# Patient Record
Sex: Female | Born: 1945 | Race: White | Hispanic: No | Smoking: Former smoker
Health system: Southern US, Community
[De-identification: ages and names within clinical notes are randomized; demographics above are authoritative.]

## PROBLEM LIST (undated history)

## (undated) ENCOUNTER — Ambulatory Visit: Admission: EM | Payer: Medicare Other | Source: Home / Self Care

## (undated) DIAGNOSIS — Z9109 Other allergy status, other than to drugs and biological substances: Secondary | ICD-10-CM

## (undated) DIAGNOSIS — T7840XA Allergy, unspecified, initial encounter: Secondary | ICD-10-CM

## (undated) DIAGNOSIS — L409 Psoriasis, unspecified: Secondary | ICD-10-CM

## (undated) DIAGNOSIS — F32A Depression, unspecified: Secondary | ICD-10-CM

## (undated) DIAGNOSIS — F419 Anxiety disorder, unspecified: Secondary | ICD-10-CM

## (undated) DIAGNOSIS — C801 Malignant (primary) neoplasm, unspecified: Secondary | ICD-10-CM

## (undated) DIAGNOSIS — L719 Rosacea, unspecified: Secondary | ICD-10-CM

## (undated) DIAGNOSIS — Z9889 Other specified postprocedural states: Secondary | ICD-10-CM

## (undated) DIAGNOSIS — E785 Hyperlipidemia, unspecified: Secondary | ICD-10-CM

## (undated) DIAGNOSIS — M1990 Unspecified osteoarthritis, unspecified site: Secondary | ICD-10-CM

## (undated) DIAGNOSIS — E039 Hypothyroidism, unspecified: Secondary | ICD-10-CM

## (undated) DIAGNOSIS — R112 Nausea with vomiting, unspecified: Secondary | ICD-10-CM

## (undated) DIAGNOSIS — E119 Type 2 diabetes mellitus without complications: Secondary | ICD-10-CM

## (undated) DIAGNOSIS — I10 Essential (primary) hypertension: Secondary | ICD-10-CM

## (undated) HISTORY — PX: TONSILLECTOMY: SUR1361

## (undated) HISTORY — PX: BACK SURGERY: SHX140

## (undated) HISTORY — DX: Depression, unspecified: F32.A

## (undated) HISTORY — DX: Anxiety disorder, unspecified: F41.9

## (undated) HISTORY — DX: Allergy, unspecified, initial encounter: T78.40XA

## (undated) HISTORY — DX: Unspecified osteoarthritis, unspecified site: M19.90

## (undated) HISTORY — DX: Other allergy status, other than to drugs and biological substances: Z91.09

## (undated) HISTORY — DX: Hypothyroidism, unspecified: E03.9

## (undated) HISTORY — DX: Essential (primary) hypertension: I10

## (undated) HISTORY — DX: Malignant (primary) neoplasm, unspecified: C80.1

## (undated) HISTORY — DX: Hyperlipidemia, unspecified: E78.5

## (undated) HISTORY — DX: Psoriasis, unspecified: L40.9

## (undated) HISTORY — PX: APPENDECTOMY: SHX54

## (undated) HISTORY — PX: DIAGNOSTIC LAPAROSCOPY: SUR761

---

## 1972-03-12 HISTORY — PX: DILATION AND CURETTAGE OF UTERUS: SHX78

## 1972-03-12 HISTORY — PX: ECTOPIC PREGNANCY SURGERY: SHX613

## 1975-03-13 HISTORY — PX: LUMBAR DISC SURGERY: SHX700

## 1982-03-12 HISTORY — PX: ABDOMINAL HYSTERECTOMY: SHX81

## 1984-03-12 HISTORY — PX: CARPAL TUNNEL RELEASE: SHX101

## 1999-01-24 ENCOUNTER — Encounter: Admission: RE | Admit: 1999-01-24 | Discharge: 1999-01-24 | Payer: Self-pay | Admitting: Obstetrics and Gynecology

## 1999-01-24 ENCOUNTER — Encounter: Payer: Self-pay | Admitting: Obstetrics and Gynecology

## 2000-01-08 ENCOUNTER — Other Ambulatory Visit: Admission: RE | Admit: 2000-01-08 | Discharge: 2000-01-08 | Payer: Self-pay | Admitting: Obstetrics and Gynecology

## 2000-01-30 ENCOUNTER — Encounter: Admission: RE | Admit: 2000-01-30 | Discharge: 2000-01-30 | Payer: Self-pay | Admitting: Obstetrics and Gynecology

## 2000-01-30 ENCOUNTER — Encounter: Payer: Self-pay | Admitting: Obstetrics and Gynecology

## 2001-03-24 ENCOUNTER — Other Ambulatory Visit: Admission: RE | Admit: 2001-03-24 | Discharge: 2001-03-24 | Payer: Self-pay | Admitting: Obstetrics and Gynecology

## 2001-03-24 ENCOUNTER — Encounter: Payer: Self-pay | Admitting: Obstetrics and Gynecology

## 2001-03-24 ENCOUNTER — Encounter: Admission: RE | Admit: 2001-03-24 | Discharge: 2001-03-24 | Payer: Self-pay | Admitting: Obstetrics and Gynecology

## 2002-04-24 ENCOUNTER — Encounter: Admission: RE | Admit: 2002-04-24 | Discharge: 2002-04-24 | Payer: Self-pay | Admitting: Obstetrics and Gynecology

## 2002-04-24 ENCOUNTER — Encounter: Payer: Self-pay | Admitting: Obstetrics and Gynecology

## 2002-05-19 ENCOUNTER — Other Ambulatory Visit: Admission: RE | Admit: 2002-05-19 | Discharge: 2002-05-19 | Payer: Self-pay | Admitting: Obstetrics and Gynecology

## 2003-08-11 ENCOUNTER — Encounter: Admission: RE | Admit: 2003-08-11 | Discharge: 2003-08-11 | Payer: Self-pay | Admitting: Obstetrics and Gynecology

## 2004-08-25 ENCOUNTER — Encounter: Admission: RE | Admit: 2004-08-25 | Discharge: 2004-08-25 | Payer: Self-pay | Admitting: Obstetrics and Gynecology

## 2005-10-04 ENCOUNTER — Encounter: Admission: RE | Admit: 2005-10-04 | Discharge: 2005-10-04 | Payer: Self-pay | Admitting: Obstetrics and Gynecology

## 2006-11-12 ENCOUNTER — Encounter: Admission: RE | Admit: 2006-11-12 | Discharge: 2006-11-12 | Payer: Self-pay | Admitting: Obstetrics and Gynecology

## 2007-12-19 ENCOUNTER — Encounter: Admission: RE | Admit: 2007-12-19 | Discharge: 2007-12-19 | Payer: Self-pay | Admitting: Obstetrics and Gynecology

## 2009-01-10 ENCOUNTER — Encounter: Admission: RE | Admit: 2009-01-10 | Discharge: 2009-01-10 | Payer: Self-pay | Admitting: Obstetrics and Gynecology

## 2009-11-10 HISTORY — PX: MELANOMA EXCISION: SHX5266

## 2010-02-16 ENCOUNTER — Encounter
Admission: RE | Admit: 2010-02-16 | Discharge: 2010-02-16 | Payer: Self-pay | Source: Home / Self Care | Admitting: Obstetrics and Gynecology

## 2011-02-27 ENCOUNTER — Other Ambulatory Visit: Payer: Self-pay | Admitting: Obstetrics and Gynecology

## 2011-02-27 DIAGNOSIS — Z1231 Encounter for screening mammogram for malignant neoplasm of breast: Secondary | ICD-10-CM

## 2011-03-28 ENCOUNTER — Ambulatory Visit
Admission: RE | Admit: 2011-03-28 | Discharge: 2011-03-28 | Disposition: A | Discharge status: Home / Self Care | Payer: Self-pay | Source: Ambulatory Visit | Attending: Obstetrics and Gynecology | Admitting: Obstetrics and Gynecology

## 2011-03-28 DIAGNOSIS — Z1231 Encounter for screening mammogram for malignant neoplasm of breast: Secondary | ICD-10-CM

## 2011-10-15 ENCOUNTER — Emergency Department (HOSPITAL_COMMUNITY)
Admission: EM | Admit: 2011-10-15 | Discharge: 2011-10-15 | Discharge status: Absent without leave | Payer: BC Managed Care – PPO | Source: Home / Self Care | Attending: Emergency Medicine | Admitting: Emergency Medicine

## 2012-02-12 ENCOUNTER — Encounter: Payer: Self-pay | Admitting: Gastroenterology

## 2012-04-30 ENCOUNTER — Other Ambulatory Visit: Payer: Self-pay | Admitting: Obstetrics and Gynecology

## 2012-04-30 DIAGNOSIS — Z1231 Encounter for screening mammogram for malignant neoplasm of breast: Secondary | ICD-10-CM

## 2012-05-26 ENCOUNTER — Ambulatory Visit
Admission: RE | Admit: 2012-05-26 | Discharge: 2012-05-26 | Disposition: A | Discharge status: Home / Self Care | Payer: 59 | Source: Ambulatory Visit | Attending: Obstetrics and Gynecology | Admitting: Obstetrics and Gynecology

## 2012-09-19 ENCOUNTER — Encounter: Payer: Self-pay | Admitting: Gastroenterology

## 2012-12-02 ENCOUNTER — Encounter: Payer: Self-pay | Admitting: Gastroenterology

## 2013-01-12 ENCOUNTER — Ambulatory Visit (AMBULATORY_SURGERY_CENTER): Payer: Medicare Other

## 2013-01-12 VITALS — Ht 60.0 in | Wt 199.0 lb

## 2013-01-12 DIAGNOSIS — Z800 Family history of malignant neoplasm of digestive organs: Secondary | ICD-10-CM

## 2013-01-12 MED ORDER — MOVIPREP 100 G PO SOLR: 1.0000 | Freq: Once | ORAL | Status: DC

## 2013-01-15 ENCOUNTER — Encounter: Payer: Self-pay | Admitting: Gastroenterology

## 2013-01-16 ENCOUNTER — Telehealth: Payer: Self-pay | Admitting: Gastroenterology

## 2013-01-23 ENCOUNTER — Encounter: Payer: 59 | Admitting: Gastroenterology

## 2013-01-30 NOTE — Telephone Encounter (Signed)
Done by nurse 

## 2013-03-05 ENCOUNTER — Emergency Department: Payer: Self-pay | Admitting: Emergency Medicine

## 2013-03-13 ENCOUNTER — Telehealth: Payer: Self-pay | Admitting: Gastroenterology

## 2013-03-19 ENCOUNTER — Encounter: Payer: Medicare Other | Admitting: Gastroenterology

## 2013-03-19 DIAGNOSIS — I10 Essential (primary) hypertension: Secondary | ICD-10-CM | POA: Diagnosis not present

## 2013-03-19 DIAGNOSIS — E039 Hypothyroidism, unspecified: Secondary | ICD-10-CM | POA: Diagnosis not present

## 2013-03-19 DIAGNOSIS — M899 Disorder of bone, unspecified: Secondary | ICD-10-CM | POA: Diagnosis not present

## 2013-03-19 DIAGNOSIS — E785 Hyperlipidemia, unspecified: Secondary | ICD-10-CM | POA: Diagnosis not present

## 2013-03-25 DIAGNOSIS — C439 Malignant melanoma of skin, unspecified: Secondary | ICD-10-CM | POA: Diagnosis not present

## 2013-03-25 DIAGNOSIS — E039 Hypothyroidism, unspecified: Secondary | ICD-10-CM | POA: Diagnosis not present

## 2013-03-25 DIAGNOSIS — E785 Hyperlipidemia, unspecified: Secondary | ICD-10-CM | POA: Diagnosis not present

## 2013-03-25 DIAGNOSIS — I4460 Unspecified fascicular block: Secondary | ICD-10-CM | POA: Diagnosis not present

## 2013-03-25 DIAGNOSIS — M899 Disorder of bone, unspecified: Secondary | ICD-10-CM | POA: Diagnosis not present

## 2013-03-25 DIAGNOSIS — Z0000 Encounter for general adult medical examination without abnormal findings: Secondary | ICD-10-CM | POA: Diagnosis not present

## 2013-03-25 DIAGNOSIS — Z1331 Encounter for screening for depression: Secondary | ICD-10-CM | POA: Diagnosis not present

## 2013-03-25 DIAGNOSIS — L408 Other psoriasis: Secondary | ICD-10-CM | POA: Diagnosis not present

## 2013-03-25 DIAGNOSIS — I10 Essential (primary) hypertension: Secondary | ICD-10-CM | POA: Diagnosis not present

## 2013-04-14 ENCOUNTER — Encounter: Payer: Self-pay | Admitting: Gastroenterology

## 2013-04-14 ENCOUNTER — Ambulatory Visit (AMBULATORY_SURGERY_CENTER): Payer: Medicare Other | Admitting: Gastroenterology

## 2013-04-14 VITALS — BP 112/73 | HR 68 | Temp 97.8°F | Resp 21 | Ht 60.0 in | Wt 199.0 lb

## 2013-04-14 DIAGNOSIS — Z800 Family history of malignant neoplasm of digestive organs: Secondary | ICD-10-CM

## 2013-04-14 DIAGNOSIS — Z1211 Encounter for screening for malignant neoplasm of colon: Secondary | ICD-10-CM

## 2013-04-14 DIAGNOSIS — I10 Essential (primary) hypertension: Secondary | ICD-10-CM | POA: Diagnosis not present

## 2013-04-14 MED ORDER — SODIUM CHLORIDE 0.9 % IV SOLN: 500.0000 mL | INTRAVENOUS | Status: DC

## 2013-04-14 NOTE — Patient Instructions (Signed)
Discharge instructions given with verbal understanding. Normal exam. Resume previous medications. YOU HAD AN ENDOSCOPIC PROCEDURE TODAY AT THE [facility]: Refer to the procedure report that was given to you for any specific questions about what was found during the examination.  If the procedure report does not answer your questions, please call your gastroenterologist to clarify.  If you requested that your care partner not be given the details of your procedure findings, then the procedure report has been included in a sealed envelope for you to review at your convenience later.  YOU SHOULD EXPECT: Some feelings of bloating in the abdomen. Passage of more gas than usual.  Walking can help get rid of the air that was put into your GI tract during the procedure and reduce the bloating. If you had a lower endoscopy (such as a colonoscopy or flexible sigmoidoscopy) you may notice spotting of blood in your stool or on the toilet paper. If you underwent a bowel prep for your procedure, then you may not have a normal bowel movement for a few days.  DIET: Your first meal following the procedure should be a light meal and then it is ok to progress to your normal diet.  A half-sandwich or bowl of soup is an example of a good first meal.  Heavy or fried foods are harder to digest and may make you feel nauseous or bloated.  Likewise meals heavy in dairy and vegetables can cause extra gas to form and this can also increase the bloating.  Drink plenty of fluids but you should avoid alcoholic beverages for 24 hours.  ACTIVITY: Your care partner should take you home directly after the procedure.  You should plan to take it easy, moving slowly for the rest of the day.  You can resume normal activity the day after the procedure however you should NOT DRIVE or use heavy machinery for 24 hours (because of the sedation medicines used during the test).    SYMPTOMS TO REPORT IMMEDIATELY: A gastroenterologist  can be reached at any hour.  During normal business hours, 8:30 AM to 5:00 PM Monday through Friday, call [phone].  After hours and on weekends, please call the GI answering service at [phone] who will take a message and have the physician on call contact you.   Following lower endoscopy (colonoscopy or flexible sigmoidoscopy):  Excessive amounts of blood in the stool  Significant tenderness or worsening of abdominal pains  Swelling of the abdomen that is new, acute  Fever of 100F or higher  FOLLOW UP: If any biopsies were taken you will be contacted by phone or by letter within the next 1-3 weeks.  Call your gastroenterologist if you have not heard about the biopsies in 3 weeks.  Our staff will call the home number listed on your records the next business day following your procedure to check on you and address any questions or concerns that you may have at that time regarding the information given to you following your procedure. This is a courtesy call and so if there is no answer at the home number and we have not heard from you through the emergency physician on call, we will assume that you have returned to your regular daily activities without incident.  SIGNATURES/CONFIDENTIALITY: You and/or your care partner have signed paperwork which will be entered into your electronic medical record.  These signatures attest to the fact that that the information above on your After Visit Summary has been reviewed   and is understood.  Full responsibility of the confidentiality of this discharge information lies with you and/or your care-partner. 

## 2013-04-14 NOTE — Progress Notes (Signed)
A/ox3 pleased with MAC, report to Celia RN 

## 2013-04-14 NOTE — Op Note (Signed)
[place]  [address] [place], [id number]   COLONOSCOPY PROCEDURE REPORT  PATIENT: [name], [name]  MR#: [id number] BIRTHDATE: 01/16/1946 , 67  yrs. old GENDER: Female ENDOSCOPIST: Ladene Artist, MD, [facility] REFERRED EG:BTDVVOH Tisovec, M.D. PROCEDURE DATE:  04/14/2013 PROCEDURE:   Colonoscopy, screening First Screening Colonoscopy - Avg.  risk and is 50 yrs.  old or older - No.  Prior Negative Screening - Now for repeat screening. Above average risk  History of Adenoma - Now for follow-up colonoscopy & has been > or = to 3 yrs.  N/A  Polyps Removed Today? No.  Recommend repeat exam, <10 yrs? Yes.  High risk (family or personal hx). ASA CLASS:   Class II INDICATIONS:elevated risk screening and Patient's immediate family history of colon cancer-brother. MEDICATIONS: MAC sedation, administered by CRNA and propofol (Diprivan) 200mg  IV DESCRIPTION OF PROCEDURE:   After the risks benefits and alternatives of the procedure were thoroughly explained, informed consent was obtained.  A digital rectal exam revealed no abnormalities of the rectum.   The LB [id number]  endoscope was introduced through the anus and advanced to the cecum, which was identified by both the appendix and ileocecal valve. No adverse events experienced.   The quality of the prep was excellent, using MoviPrep  The instrument was then slowly withdrawn as the colon was fully examined.  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  No bleeding was noted.   The colon was otherwise normal.  There was no diverticulosis, inflammation, polyps or cancers unless previously stated.  Retroflexed views revealed no abnormalities. The time to cecum=2 minutes 26 seconds.  Withdrawal time=8 minutes 37 seconds. The scope was withdrawn and the procedure completed. COMPLICATIONS: There were no complications.  ENDOSCOPIC IMPRESSION: 1.   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   The colon was otherwise  normal  RECOMMENDATIONS: 1.  Repeat Colonoscopy in 5 years.   eSigned:  Ladene Artist, MD, [facility] 04/14/2013 11:12 AM

## 2013-04-15 ENCOUNTER — Telehealth: Payer: Self-pay | Admitting: *Deleted

## 2013-04-15 NOTE — Telephone Encounter (Signed)
  Follow up Call-  Call back number 04/14/2013  Post procedure Call Back phone  # [phone]  Permission to leave phone message Yes     Patient questions:  Do you have a fever, pain , or abdominal swelling? no Pain Score  0 *  Have you tolerated food without any problems? yes  Have you been able to return to your normal activities? yes  Do you have any questions about your discharge instructions: Diet   no Medications  no Follow up visit  no  Do you have questions or concerns about your Care? no  Actions: * If pain score is 4 or above: No action needed, pain <4.  Stated she wanted to know what avm stand for explained to pt. And informed her there was nothing for her to worry about. Pt. Stated she was doing great.

## 2013-05-08 DIAGNOSIS — L570 Actinic keratosis: Secondary | ICD-10-CM | POA: Diagnosis not present

## 2013-05-08 DIAGNOSIS — D1801 Hemangioma of skin and subcutaneous tissue: Secondary | ICD-10-CM | POA: Diagnosis not present

## 2013-05-08 DIAGNOSIS — Z85820 Personal history of malignant melanoma of skin: Secondary | ICD-10-CM | POA: Diagnosis not present

## 2013-05-08 DIAGNOSIS — L821 Other seborrheic keratosis: Secondary | ICD-10-CM | POA: Diagnosis not present

## 2013-05-08 DIAGNOSIS — I789 Disease of capillaries, unspecified: Secondary | ICD-10-CM | POA: Diagnosis not present

## 2013-05-08 DIAGNOSIS — L408 Other psoriasis: Secondary | ICD-10-CM | POA: Diagnosis not present

## 2013-05-08 DIAGNOSIS — L909 Atrophic disorder of skin, unspecified: Secondary | ICD-10-CM | POA: Diagnosis not present

## 2013-05-08 DIAGNOSIS — L819 Disorder of pigmentation, unspecified: Secondary | ICD-10-CM | POA: Diagnosis not present

## 2013-05-08 DIAGNOSIS — L919 Hypertrophic disorder of the skin, unspecified: Secondary | ICD-10-CM | POA: Diagnosis not present

## 2013-06-08 ENCOUNTER — Emergency Department: Payer: Self-pay | Admitting: Emergency Medicine

## 2013-06-08 ENCOUNTER — Other Ambulatory Visit: Payer: Self-pay

## 2013-06-08 DIAGNOSIS — Z1231 Encounter for screening mammogram for malignant neoplasm of breast: Secondary | ICD-10-CM

## 2013-06-08 DIAGNOSIS — Z79899 Other long term (current) drug therapy: Secondary | ICD-10-CM | POA: Diagnosis not present

## 2013-06-08 DIAGNOSIS — S71009A Unspecified open wound, unspecified hip, initial encounter: Secondary | ICD-10-CM | POA: Diagnosis not present

## 2013-06-08 DIAGNOSIS — M1610 Unilateral primary osteoarthritis, unspecified hip: Secondary | ICD-10-CM | POA: Diagnosis not present

## 2013-06-08 DIAGNOSIS — IMO0002 Reserved for concepts with insufficient information to code with codable children: Secondary | ICD-10-CM | POA: Diagnosis not present

## 2013-06-08 DIAGNOSIS — R634 Abnormal weight loss: Secondary | ICD-10-CM | POA: Diagnosis not present

## 2013-06-08 DIAGNOSIS — S32009A Unspecified fracture of unspecified lumbar vertebra, initial encounter for closed fracture: Secondary | ICD-10-CM | POA: Diagnosis not present

## 2013-06-08 DIAGNOSIS — I10 Essential (primary) hypertension: Secondary | ICD-10-CM | POA: Diagnosis not present

## 2013-06-08 DIAGNOSIS — Z90710 Acquired absence of both cervix and uterus: Secondary | ICD-10-CM | POA: Diagnosis not present

## 2013-06-08 DIAGNOSIS — E039 Hypothyroidism, unspecified: Secondary | ICD-10-CM | POA: Diagnosis not present

## 2013-06-08 DIAGNOSIS — S72009A Fracture of unspecified part of neck of unspecified femur, initial encounter for closed fracture: Secondary | ICD-10-CM | POA: Diagnosis not present

## 2013-06-08 DIAGNOSIS — M169 Osteoarthritis of hip, unspecified: Secondary | ICD-10-CM | POA: Diagnosis not present

## 2013-06-08 DIAGNOSIS — M549 Dorsalgia, unspecified: Secondary | ICD-10-CM | POA: Diagnosis not present

## 2013-06-10 DIAGNOSIS — S72109A Unspecified trochanteric fracture of unspecified femur, initial encounter for closed fracture: Secondary | ICD-10-CM | POA: Diagnosis not present

## 2013-06-10 DIAGNOSIS — S32009A Unspecified fracture of unspecified lumbar vertebra, initial encounter for closed fracture: Secondary | ICD-10-CM | POA: Diagnosis not present

## 2013-06-30 ENCOUNTER — Ambulatory Visit
Admission: RE | Admit: 2013-06-30 | Discharge: 2013-06-30 | Disposition: A | Discharge status: Home / Self Care | Payer: Medicare Other | Source: Ambulatory Visit

## 2013-06-30 DIAGNOSIS — Z1231 Encounter for screening mammogram for malignant neoplasm of breast: Secondary | ICD-10-CM

## 2013-07-03 DIAGNOSIS — S72009D Fracture of unspecified part of neck of unspecified femur, subsequent encounter for closed fracture with routine healing: Secondary | ICD-10-CM | POA: Diagnosis not present

## 2013-07-03 DIAGNOSIS — IMO0002 Reserved for concepts with insufficient information to code with codable children: Secondary | ICD-10-CM | POA: Diagnosis not present

## 2013-07-08 DIAGNOSIS — L408 Other psoriasis: Secondary | ICD-10-CM | POA: Diagnosis not present

## 2013-07-08 DIAGNOSIS — Z79899 Other long term (current) drug therapy: Secondary | ICD-10-CM | POA: Diagnosis not present

## 2013-07-10 DIAGNOSIS — Z01419 Encounter for gynecological examination (general) (routine) without abnormal findings: Secondary | ICD-10-CM | POA: Diagnosis not present

## 2013-07-10 DIAGNOSIS — Z124 Encounter for screening for malignant neoplasm of cervix: Secondary | ICD-10-CM | POA: Diagnosis not present

## 2013-09-22 DIAGNOSIS — Z23 Encounter for immunization: Secondary | ICD-10-CM | POA: Diagnosis not present

## 2013-09-22 DIAGNOSIS — E785 Hyperlipidemia, unspecified: Secondary | ICD-10-CM | POA: Diagnosis not present

## 2013-09-22 DIAGNOSIS — E039 Hypothyroidism, unspecified: Secondary | ICD-10-CM | POA: Diagnosis not present

## 2013-09-22 DIAGNOSIS — I10 Essential (primary) hypertension: Secondary | ICD-10-CM | POA: Diagnosis not present

## 2013-09-22 DIAGNOSIS — F321 Major depressive disorder, single episode, moderate: Secondary | ICD-10-CM | POA: Diagnosis not present

## 2013-09-22 DIAGNOSIS — M899 Disorder of bone, unspecified: Secondary | ICD-10-CM | POA: Diagnosis not present

## 2013-09-22 DIAGNOSIS — J301 Allergic rhinitis due to pollen: Secondary | ICD-10-CM | POA: Diagnosis not present

## 2013-09-22 DIAGNOSIS — L719 Rosacea, unspecified: Secondary | ICD-10-CM | POA: Diagnosis not present

## 2013-09-22 DIAGNOSIS — M949 Disorder of cartilage, unspecified: Secondary | ICD-10-CM | POA: Diagnosis not present

## 2013-11-06 DIAGNOSIS — L408 Other psoriasis: Secondary | ICD-10-CM | POA: Diagnosis not present

## 2013-11-06 DIAGNOSIS — D1801 Hemangioma of skin and subcutaneous tissue: Secondary | ICD-10-CM | POA: Diagnosis not present

## 2013-11-06 DIAGNOSIS — L719 Rosacea, unspecified: Secondary | ICD-10-CM | POA: Diagnosis not present

## 2013-11-06 DIAGNOSIS — Z79899 Other long term (current) drug therapy: Secondary | ICD-10-CM | POA: Diagnosis not present

## 2013-12-10 DIAGNOSIS — Z23 Encounter for immunization: Secondary | ICD-10-CM | POA: Diagnosis not present

## 2014-02-11 DIAGNOSIS — L718 Other rosacea: Secondary | ICD-10-CM | POA: Diagnosis not present

## 2014-02-11 DIAGNOSIS — Z79899 Other long term (current) drug therapy: Secondary | ICD-10-CM | POA: Diagnosis not present

## 2014-02-11 DIAGNOSIS — D1801 Hemangioma of skin and subcutaneous tissue: Secondary | ICD-10-CM | POA: Diagnosis not present

## 2014-02-11 DIAGNOSIS — L400 Psoriasis vulgaris: Secondary | ICD-10-CM | POA: Diagnosis not present

## 2014-02-26 DIAGNOSIS — Z79899 Other long term (current) drug therapy: Secondary | ICD-10-CM | POA: Diagnosis not present

## 2014-02-26 DIAGNOSIS — L400 Psoriasis vulgaris: Secondary | ICD-10-CM | POA: Diagnosis not present

## 2014-03-31 DIAGNOSIS — H0016 Chalazion left eye, unspecified eyelid: Secondary | ICD-10-CM | POA: Diagnosis not present

## 2014-05-10 DIAGNOSIS — M859 Disorder of bone density and structure, unspecified: Secondary | ICD-10-CM | POA: Diagnosis not present

## 2014-05-10 DIAGNOSIS — E785 Hyperlipidemia, unspecified: Secondary | ICD-10-CM | POA: Diagnosis not present

## 2014-05-10 DIAGNOSIS — E039 Hypothyroidism, unspecified: Secondary | ICD-10-CM | POA: Diagnosis not present

## 2014-05-10 DIAGNOSIS — I10 Essential (primary) hypertension: Secondary | ICD-10-CM | POA: Diagnosis not present

## 2014-05-11 DIAGNOSIS — Z1389 Encounter for screening for other disorder: Secondary | ICD-10-CM | POA: Diagnosis not present

## 2014-05-11 DIAGNOSIS — L409 Psoriasis, unspecified: Secondary | ICD-10-CM | POA: Diagnosis not present

## 2014-05-11 DIAGNOSIS — M859 Disorder of bone density and structure, unspecified: Secondary | ICD-10-CM | POA: Diagnosis not present

## 2014-05-11 DIAGNOSIS — Z79899 Other long term (current) drug therapy: Secondary | ICD-10-CM | POA: Diagnosis not present

## 2014-05-11 DIAGNOSIS — E119 Type 2 diabetes mellitus without complications: Secondary | ICD-10-CM | POA: Diagnosis not present

## 2014-05-11 DIAGNOSIS — E785 Hyperlipidemia, unspecified: Secondary | ICD-10-CM | POA: Diagnosis not present

## 2014-05-11 DIAGNOSIS — Z6841 Body Mass Index (BMI) 40.0 and over, adult: Secondary | ICD-10-CM | POA: Diagnosis not present

## 2014-05-11 DIAGNOSIS — I4460 Unspecified fascicular block: Secondary | ICD-10-CM | POA: Diagnosis not present

## 2014-05-11 DIAGNOSIS — Z0000 Encounter for general adult medical examination without abnormal findings: Secondary | ICD-10-CM | POA: Diagnosis not present

## 2014-05-11 DIAGNOSIS — F321 Major depressive disorder, single episode, moderate: Secondary | ICD-10-CM | POA: Diagnosis not present

## 2014-05-13 DIAGNOSIS — Z1212 Encounter for screening for malignant neoplasm of rectum: Secondary | ICD-10-CM | POA: Diagnosis not present

## 2014-06-22 DIAGNOSIS — E119 Type 2 diabetes mellitus without complications: Secondary | ICD-10-CM | POA: Diagnosis not present

## 2014-06-22 DIAGNOSIS — Z6838 Body mass index (BMI) 38.0-38.9, adult: Secondary | ICD-10-CM | POA: Diagnosis not present

## 2014-06-22 DIAGNOSIS — I10 Essential (primary) hypertension: Secondary | ICD-10-CM | POA: Diagnosis not present

## 2014-06-30 DIAGNOSIS — L719 Rosacea, unspecified: Secondary | ICD-10-CM | POA: Diagnosis not present

## 2014-07-02 DIAGNOSIS — L718 Other rosacea: Secondary | ICD-10-CM | POA: Diagnosis not present

## 2014-07-02 DIAGNOSIS — D2261 Melanocytic nevi of right upper limb, including shoulder: Secondary | ICD-10-CM | POA: Diagnosis not present

## 2014-07-02 DIAGNOSIS — L821 Other seborrheic keratosis: Secondary | ICD-10-CM | POA: Diagnosis not present

## 2014-07-02 DIAGNOSIS — D485 Neoplasm of uncertain behavior of skin: Secondary | ICD-10-CM | POA: Diagnosis not present

## 2014-07-02 DIAGNOSIS — L400 Psoriasis vulgaris: Secondary | ICD-10-CM | POA: Diagnosis not present

## 2014-07-02 DIAGNOSIS — Z85820 Personal history of malignant melanoma of skin: Secondary | ICD-10-CM | POA: Diagnosis not present

## 2014-07-02 DIAGNOSIS — L814 Other melanin hyperpigmentation: Secondary | ICD-10-CM | POA: Diagnosis not present

## 2014-07-02 DIAGNOSIS — L918 Other hypertrophic disorders of the skin: Secondary | ICD-10-CM | POA: Diagnosis not present

## 2014-07-06 ENCOUNTER — Other Ambulatory Visit: Payer: Self-pay

## 2014-07-06 DIAGNOSIS — Z1231 Encounter for screening mammogram for malignant neoplasm of breast: Secondary | ICD-10-CM

## 2014-07-08 DIAGNOSIS — H01009 Unspecified blepharitis unspecified eye, unspecified eyelid: Secondary | ICD-10-CM | POA: Diagnosis not present

## 2014-08-02 DIAGNOSIS — L718 Other rosacea: Secondary | ICD-10-CM | POA: Diagnosis not present

## 2014-08-10 ENCOUNTER — Ambulatory Visit
Admission: RE | Admit: 2014-08-10 | Discharge: 2014-08-10 | Disposition: A | Discharge status: Home / Self Care | Payer: Medicare Other | Source: Ambulatory Visit

## 2014-08-10 DIAGNOSIS — F321 Major depressive disorder, single episode, moderate: Secondary | ICD-10-CM | POA: Diagnosis not present

## 2014-08-10 DIAGNOSIS — E119 Type 2 diabetes mellitus without complications: Secondary | ICD-10-CM | POA: Diagnosis not present

## 2014-08-10 DIAGNOSIS — Z1231 Encounter for screening mammogram for malignant neoplasm of breast: Secondary | ICD-10-CM

## 2014-08-10 DIAGNOSIS — L409 Psoriasis, unspecified: Secondary | ICD-10-CM | POA: Diagnosis not present

## 2014-08-10 DIAGNOSIS — M859 Disorder of bone density and structure, unspecified: Secondary | ICD-10-CM | POA: Diagnosis not present

## 2014-08-10 DIAGNOSIS — E039 Hypothyroidism, unspecified: Secondary | ICD-10-CM | POA: Diagnosis not present

## 2014-08-10 DIAGNOSIS — Z6836 Body mass index (BMI) 36.0-36.9, adult: Secondary | ICD-10-CM | POA: Diagnosis not present

## 2014-08-11 ENCOUNTER — Other Ambulatory Visit: Payer: Self-pay | Admitting: Obstetrics and Gynecology

## 2014-08-11 DIAGNOSIS — R928 Other abnormal and inconclusive findings on diagnostic imaging of breast: Secondary | ICD-10-CM

## 2014-08-12 ENCOUNTER — Ambulatory Visit
Admission: RE | Admit: 2014-08-12 | Discharge: 2014-08-12 | Disposition: A | Discharge status: Home / Self Care | Payer: Medicare Other | Source: Ambulatory Visit | Attending: Obstetrics and Gynecology | Admitting: Obstetrics and Gynecology

## 2014-08-12 DIAGNOSIS — R928 Other abnormal and inconclusive findings on diagnostic imaging of breast: Secondary | ICD-10-CM

## 2014-10-01 DIAGNOSIS — Z79899 Other long term (current) drug therapy: Secondary | ICD-10-CM | POA: Diagnosis not present

## 2014-10-01 DIAGNOSIS — L400 Psoriasis vulgaris: Secondary | ICD-10-CM | POA: Diagnosis not present

## 2014-10-25 DIAGNOSIS — M546 Pain in thoracic spine: Secondary | ICD-10-CM | POA: Diagnosis not present

## 2014-10-25 DIAGNOSIS — G589 Mononeuropathy, unspecified: Secondary | ICD-10-CM | POA: Diagnosis not present

## 2014-10-25 DIAGNOSIS — Z6834 Body mass index (BMI) 34.0-34.9, adult: Secondary | ICD-10-CM | POA: Diagnosis not present

## 2014-12-10 DIAGNOSIS — F321 Major depressive disorder, single episode, moderate: Secondary | ICD-10-CM | POA: Diagnosis not present

## 2014-12-10 DIAGNOSIS — E039 Hypothyroidism, unspecified: Secondary | ICD-10-CM | POA: Diagnosis not present

## 2014-12-10 DIAGNOSIS — M859 Disorder of bone density and structure, unspecified: Secondary | ICD-10-CM | POA: Diagnosis not present

## 2014-12-10 DIAGNOSIS — Z23 Encounter for immunization: Secondary | ICD-10-CM | POA: Diagnosis not present

## 2014-12-10 DIAGNOSIS — E785 Hyperlipidemia, unspecified: Secondary | ICD-10-CM | POA: Diagnosis not present

## 2014-12-10 DIAGNOSIS — I10 Essential (primary) hypertension: Secondary | ICD-10-CM | POA: Diagnosis not present

## 2014-12-10 DIAGNOSIS — L409 Psoriasis, unspecified: Secondary | ICD-10-CM | POA: Diagnosis not present

## 2014-12-10 DIAGNOSIS — E119 Type 2 diabetes mellitus without complications: Secondary | ICD-10-CM | POA: Diagnosis not present

## 2014-12-10 DIAGNOSIS — J302 Other seasonal allergic rhinitis: Secondary | ICD-10-CM | POA: Diagnosis not present

## 2014-12-10 DIAGNOSIS — Z6832 Body mass index (BMI) 32.0-32.9, adult: Secondary | ICD-10-CM | POA: Diagnosis not present

## 2015-01-05 DIAGNOSIS — L400 Psoriasis vulgaris: Secondary | ICD-10-CM | POA: Diagnosis not present

## 2015-03-17 DIAGNOSIS — D485 Neoplasm of uncertain behavior of skin: Secondary | ICD-10-CM | POA: Diagnosis not present

## 2015-03-17 DIAGNOSIS — Z79899 Other long term (current) drug therapy: Secondary | ICD-10-CM | POA: Diagnosis not present

## 2015-03-17 DIAGNOSIS — L400 Psoriasis vulgaris: Secondary | ICD-10-CM | POA: Diagnosis not present

## 2015-03-17 DIAGNOSIS — C4361 Malignant melanoma of right upper limb, including shoulder: Secondary | ICD-10-CM | POA: Diagnosis not present

## 2015-03-17 DIAGNOSIS — D1801 Hemangioma of skin and subcutaneous tissue: Secondary | ICD-10-CM | POA: Diagnosis not present

## 2015-03-17 DIAGNOSIS — L718 Other rosacea: Secondary | ICD-10-CM | POA: Diagnosis not present

## 2015-03-24 ENCOUNTER — Ambulatory Visit: Payer: Self-pay | Admitting: Surgery

## 2015-03-24 DIAGNOSIS — C4360 Malignant melanoma of unspecified upper limb, including shoulder: Secondary | ICD-10-CM | POA: Diagnosis not present

## 2015-03-24 DIAGNOSIS — C439 Malignant melanoma of skin, unspecified: Secondary | ICD-10-CM

## 2015-03-24 NOTE — H&P (Signed)
Armetta C. Cassaday 03/24/2015 2:41 PM Location: [facility] Patient #: [id number] DOB: 03-Apr-1945 Undefined / Language: [name] / Race: White Female  History of Present Illness Marcello Moores A. Catrell Morrone MD; 03/24/2015 3:36 PM) Patient words: right shoulder melanoma/ needs SLN biopsy   Pt sent at the request of Dr Ubaldo Glassing for right shoulder melanoma. She has a personal hx of melanoma in the past.   path shows superficial spreading melanoma  Clark level 4 1.1 cm thickness   Denies complaints.  The patient is a 70 year old female.   Other Problems [name], CMA; 03/24/2015 2:42 PM) Back Pain Diabetes Mellitus Gastroesophageal Reflux Disease High blood pressure Melanoma Thyroid Disease  Past Surgical History [name], [place]; 03/24/2015 2:42 PM) Appendectomy Cesarean Section - Multiple Hysterectomy (not due to cancer) - Complete  Diagnostic Studies History [name], [place]; 03/24/2015 2:42 PM) Colonoscopy 1-5 years ago Mammogram within last year  Allergies [name], CMA; 03/24/2015 2:43 PM) Penicillin G Pot in Dextrose *PENICILLINS*  Medication History [name], CMA; 03/24/2015 2:44 PM) Calcipotriene-Betameth Diprop (0.005-0.064% Ointment, External) Active. Synthroid (100MCG Tablet, Oral) Active. Lisinopril-Hydrochlorothiazide (10-12.5MG  Tablet, Oral) Active. Montelukast Sodium (10MG  Tablet, Oral) Active. Finacea (15% Gel, External) Active. Aspirin (81MG  Tablet DR, Oral) Active. Calcium Carbonate-Vit D-Min (600-200MG -UNIT Tablet, Oral) Active. Medications Reconciled  Pregnancy / Birth History [name], [place]; 03/24/2015 2:42 PM) Age at menarche 71 years. Age of menopause <47 Gravida 4 Maternal age 56-30 Para 2     Review of Systems [name]; 03/24/2015 2:42 PM) General Not Present- Appetite Loss, Chills, Fatigue, Fever, Night Sweats, Weight Gain and Weight Loss. Skin Not Present-  Change in Wart/Mole, Dryness, Hives, Jaundice, New Lesions, Non-Healing Wounds, Rash and Ulcer. HEENT Present- Seasonal Allergies and Wears glasses/contact lenses. Not Present- Earache, Hearing Loss, Hoarseness, Nose Bleed, Oral Ulcers, Ringing in the Ears, Sinus Pain, Sore Throat, Visual Disturbances and Yellow Eyes. Respiratory Not Present- Bloody sputum, Chronic Cough, Difficulty Breathing, Snoring and Wheezing. Breast Not Present- Breast Mass, Breast Pain, Nipple Discharge and Skin Changes. Cardiovascular Not Present- Chest Pain, Difficulty Breathing Lying Down, Leg Cramps, Palpitations, Rapid Heart Rate, Shortness of Breath and Swelling of Extremities. Gastrointestinal Present- Hemorrhoids. Not Present- Abdominal Pain, Bloating, Bloody Stool, Change in Bowel Habits, Chronic diarrhea, Constipation, Difficulty Swallowing, Excessive gas, Gets full quickly at meals, Indigestion, Nausea, Rectal Pain and Vomiting. Female Genitourinary Not Present- Frequency, Nocturia, Painful Urination, Pelvic Pain and Urgency. Musculoskeletal Not Present- Back Pain, Joint Pain, Joint Stiffness, Muscle Pain, Muscle Weakness and Swelling of Extremities. Neurological Not Present- Decreased Memory, Fainting, Headaches, Numbness, Seizures, Tingling, Tremor, Trouble walking and Weakness. Psychiatric Not Present- Anxiety, Bipolar, Change in Sleep Pattern, Depression, Fearful and Frequent crying. Endocrine Not Present- Cold Intolerance, Excessive Hunger, Hair Changes, Heat Intolerance, Hot flashes and New Diabetes. Hematology Present- Easy Bruising. Not Present- Excessive bleeding, Gland problems, HIV and Persistent Infections.  [name]; 03/24/2015 2:41 PM) 03/24/2015 2:41 PM Weight: 154 lb Height: 59in Body Surface Area: 1.65 m Body Mass Index: 31.1 kg/m  Temp.: 98.65F(Oral)  BP: 132/78 (Sitting, Left Arm, Standard)      Physical Exam (Muaaz Brau A. Michio Thier MD; 03/24/2015 3:37  PM)  Integumentary Note: 1 cm incision right shoulder clean sutures intact  Chest and Lung Exam Chest and lung exam reveals -quiet, even and easy respiratory effort with no use of accessory muscles and on auscultation, normal breath sounds, no adventitious sounds and normal vocal resonance. Inspection Chest Wall - Normal.  Back - normal.  Cardiovascular Cardiovascular examination reveals -on palpation PMI is normal in location and amplitude, no palpable S3 or S4. Normal cardiac borders., normal heart sounds, regular rate and rhythm with no murmurs, carotid auscultation reveals no bruits and normal pedal pulses bilaterally.  Lymphatic Head & Neck  General Head & Neck Lymphatics: Bilateral - Description - Normal. Axillary  General Axillary Region: Bilateral - Description - Normal. Tenderness - Non Tender.    Assessment & Plan (Brae Gartman A. 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 stiffness, dye allergy. cosmetic deformity , blood clots, death, need for more surgery. Pt agres to proceed.  Discussed risks of infection cosmetic deformity shoulder stiffness and adjacent organ injury she will need preop lymphscintigraphy she agrees to proceed  Current Plans The anatomy and the physiology was discussed. The pathophysiology and natural history of the disease was discussed. Options were discussed and recommendations were made. Technique, risks, benefits, & alternatives were discussed. Risks such as stroke, heart attack, bleeding, indection, death, and other risks discussed. Questions answered. The patient agrees to proceed. The pathophysiology of skin & subcutaneous masses was discussed. Natural history risks without surgery were  discussed. I recommended surgery to remove the mass. I explained the technique of removal with use of local anesthesia & possible need for more aggressive sedation/anesthesia for patient comfort.  Risks such as bleeding, infection, wound breakdown, heart attack, death, and other risks were discussed. I noted a good likelihood this will help address the problem. Possibility that this will not correct all symptoms was explained. Possibility of regrowth/recurrence of the mass was discussed. We will work to minimize complications. Questions were answered. The patient expresses understanding & wishes to proceed with surgery.  Pt Education - CCS Free Text Education/Instructions: discussed with patient and provided information.

## 2015-03-31 DIAGNOSIS — L918 Other hypertrophic disorders of the skin: Secondary | ICD-10-CM | POA: Diagnosis not present

## 2015-03-31 DIAGNOSIS — Z85820 Personal history of malignant melanoma of skin: Secondary | ICD-10-CM | POA: Diagnosis not present

## 2015-03-31 DIAGNOSIS — L400 Psoriasis vulgaris: Secondary | ICD-10-CM | POA: Diagnosis not present

## 2015-03-31 DIAGNOSIS — D1801 Hemangioma of skin and subcutaneous tissue: Secondary | ICD-10-CM | POA: Diagnosis not present

## 2015-03-31 DIAGNOSIS — L821 Other seborrheic keratosis: Secondary | ICD-10-CM | POA: Diagnosis not present

## 2015-04-06 ENCOUNTER — Other Ambulatory Visit (HOSPITAL_COMMUNITY): Payer: Self-pay | Admitting: Surgery

## 2015-04-06 DIAGNOSIS — C4361 Malignant melanoma of right upper limb, including shoulder: Secondary | ICD-10-CM

## 2015-04-13 ENCOUNTER — Ambulatory Visit (HOSPITAL_COMMUNITY)
Admission: RE | Admit: 2015-04-13 | Discharge: 2015-04-13 | Disposition: A | Discharge status: Home / Self Care | Payer: Medicare Other | Source: Ambulatory Visit | Attending: Surgery | Admitting: Surgery

## 2015-04-13 DIAGNOSIS — C4361 Malignant melanoma of right upper limb, including shoulder: Secondary | ICD-10-CM | POA: Diagnosis not present

## 2015-04-13 MED ORDER — TECHNETIUM TC 99M SULFUR COLLOID FILTERED
0.5000 | Freq: Once | INTRAVENOUS | Status: AC | PRN
  Administered 2015-04-13: 0.5 via INTRADERMAL

## 2015-04-15 ENCOUNTER — Encounter (HOSPITAL_BASED_OUTPATIENT_CLINIC_OR_DEPARTMENT_OTHER): Payer: Self-pay | Admitting: *Deleted

## 2015-04-18 ENCOUNTER — Other Ambulatory Visit: Payer: Self-pay

## 2015-04-18 ENCOUNTER — Encounter (HOSPITAL_BASED_OUTPATIENT_CLINIC_OR_DEPARTMENT_OTHER)
Admission: RE | Admit: 2015-04-18 | Discharge: 2015-04-18 | Disposition: A | Discharge status: Home / Self Care | Payer: Medicare Other | Source: Ambulatory Visit | Attending: Surgery | Admitting: Surgery

## 2015-04-18 DIAGNOSIS — E119 Type 2 diabetes mellitus without complications: Secondary | ICD-10-CM | POA: Diagnosis not present

## 2015-04-18 DIAGNOSIS — Z87891 Personal history of nicotine dependence: Secondary | ICD-10-CM | POA: Diagnosis not present

## 2015-04-18 DIAGNOSIS — C4361 Malignant melanoma of right upper limb, including shoulder: Secondary | ICD-10-CM | POA: Diagnosis not present

## 2015-04-18 DIAGNOSIS — E039 Hypothyroidism, unspecified: Secondary | ICD-10-CM | POA: Diagnosis not present

## 2015-04-18 DIAGNOSIS — I10 Essential (primary) hypertension: Secondary | ICD-10-CM | POA: Diagnosis not present

## 2015-04-18 LAB — CBC WITH DIFFERENTIAL/PLATELET
BASOS ABS: 0 10*3/uL (ref 0.0–0.1)
BASOS PCT: 0 %
EOS ABS: 0.2 10*3/uL (ref 0.0–0.7)
Eosinophils Relative: 2 %
HCT: 41.4 % (ref 36.0–46.0)
Hemoglobin: 13.6 g/dL (ref 12.0–15.0)
LYMPHS ABS: 4.5 10*3/uL — AB (ref 0.7–4.0)
Lymphocytes Relative: 43 %
MCH: 30.2 pg (ref 26.0–34.0)
MCHC: 32.9 g/dL (ref 30.0–36.0)
MCV: 91.8 fL (ref 78.0–100.0)
Monocytes Absolute: 0.7 10*3/uL (ref 0.1–1.0)
Monocytes Relative: 7 %
NEUTROS ABS: 5.1 10*3/uL (ref 1.7–7.7)
NEUTROS PCT: 48 %
PLATELETS: 211 10*3/uL (ref 150–400)
RBC: 4.51 MIL/uL (ref 3.87–5.11)
RDW: 13.4 % (ref 11.5–15.5)
WBC: 10.5 10*3/uL (ref 4.0–10.5)

## 2015-04-18 LAB — COMPREHENSIVE METABOLIC PANEL
ALBUMIN: 3.7 g/dL (ref 3.5–5.0)
ALT: 28 U/L (ref 14–54)
AST: 31 U/L (ref 15–41)
Alkaline Phosphatase: 64 U/L (ref 38–126)
Anion gap: 11 (ref 5–15)
BUN: 17 mg/dL (ref 6–20)
CO2: 29 mmol/L (ref 22–32)
Calcium: 9.4 mg/dL (ref 8.9–10.3)
Chloride: 92 mmol/L — ABNORMAL LOW (ref 101–111)
Creatinine, Ser: 0.85 mg/dL (ref 0.44–1.00)
GFR calc Af Amer: >60 mL/min (ref >60)
GFR calc non Af Amer: >60 mL/min (ref >60)
GLUCOSE: 98 mg/dL (ref 65–99)
POTASSIUM: 3.9 mmol/L (ref 3.5–5.1)
Sodium: 132 mmol/L — ABNORMAL LOW (ref 135–145)
Total Bilirubin: 0.9 mg/dL (ref 0.3–1.2)
Total Protein: 6.7 g/dL (ref 6.5–8.1)

## 2015-04-21 ENCOUNTER — Ambulatory Visit (HOSPITAL_BASED_OUTPATIENT_CLINIC_OR_DEPARTMENT_OTHER)
Admission: RE | Admit: 2015-04-21 | Discharge: 2015-04-21 | Disposition: A | Discharge status: Home / Self Care | Payer: Medicare Other | Source: Ambulatory Visit | Attending: Surgery | Admitting: Surgery

## 2015-04-21 ENCOUNTER — Encounter (HOSPITAL_BASED_OUTPATIENT_CLINIC_OR_DEPARTMENT_OTHER): Payer: Self-pay | Admitting: *Deleted

## 2015-04-21 ENCOUNTER — Ambulatory Visit (HOSPITAL_COMMUNITY)
Admission: RE | Admit: 2015-04-21 | Discharge: 2015-04-21 | Disposition: A | Discharge status: Home / Self Care | Payer: Medicare Other | Source: Ambulatory Visit | Attending: Surgery | Admitting: Surgery

## 2015-04-21 ENCOUNTER — Ambulatory Visit (HOSPITAL_BASED_OUTPATIENT_CLINIC_OR_DEPARTMENT_OTHER): Payer: Medicare Other | Admitting: Anesthesiology

## 2015-04-21 ENCOUNTER — Encounter (HOSPITAL_BASED_OUTPATIENT_CLINIC_OR_DEPARTMENT_OTHER)
Admission: RE | Disposition: A | Discharge status: Home / Self Care | Payer: Self-pay | Source: Ambulatory Visit | Attending: Surgery

## 2015-04-21 DIAGNOSIS — C439 Malignant melanoma of skin, unspecified: Secondary | ICD-10-CM

## 2015-04-21 DIAGNOSIS — E119 Type 2 diabetes mellitus without complications: Secondary | ICD-10-CM | POA: Insufficient documentation

## 2015-04-21 DIAGNOSIS — M25511 Pain in right shoulder: Secondary | ICD-10-CM | POA: Diagnosis not present

## 2015-04-21 DIAGNOSIS — C4361 Malignant melanoma of right upper limb, including shoulder: Secondary | ICD-10-CM | POA: Diagnosis not present

## 2015-04-21 DIAGNOSIS — I10 Essential (primary) hypertension: Secondary | ICD-10-CM | POA: Insufficient documentation

## 2015-04-21 DIAGNOSIS — Z87891 Personal history of nicotine dependence: Secondary | ICD-10-CM | POA: Insufficient documentation

## 2015-04-21 DIAGNOSIS — E039 Hypothyroidism, unspecified: Secondary | ICD-10-CM | POA: Diagnosis not present

## 2015-04-21 DIAGNOSIS — G8918 Other acute postprocedural pain: Secondary | ICD-10-CM | POA: Diagnosis not present

## 2015-04-21 DIAGNOSIS — L859 Epidermal thickening, unspecified: Secondary | ICD-10-CM | POA: Diagnosis not present

## 2015-04-21 HISTORY — DX: Rosacea, unspecified: L71.9

## 2015-04-21 HISTORY — DX: Other specified postprocedural states: Z98.890

## 2015-04-21 HISTORY — PX: MELANOMA EXCISION WITH SENTINEL LYMPH NODE BIOPSY: SHX5267

## 2015-04-21 HISTORY — DX: Nausea with vomiting, unspecified: R11.2

## 2015-04-21 SURGERY — MELANOMA EXCISION WITH SENTINEL LYMPH NODE BIOPSY
Anesthesia: General | Site: Shoulder | Laterality: Right

## 2015-04-21 MED ORDER — TECHNETIUM TC 99M SULFUR COLLOID FILTERED: 0.5000 | Freq: Once | INTRAVENOUS | Status: DC | PRN

## 2015-04-21 MED ORDER — FENTANYL CITRATE (PF) 100 MCG/2ML IJ SOLN
INTRAMUSCULAR | Status: AC
  Filled 2015-04-21: qty 2

## 2015-04-21 MED ORDER — ONDANSETRON HCL 4 MG/2ML IJ SOLN
INTRAMUSCULAR | Status: DC | PRN
  Administered 2015-04-21: 4 mg via INTRAVENOUS

## 2015-04-21 MED ORDER — LACTATED RINGERS IV SOLN
INTRAVENOUS | Status: DC
  Administered 2015-04-21 (×3): via INTRAVENOUS

## 2015-04-21 MED ORDER — DEXAMETHASONE SODIUM PHOSPHATE 10 MG/ML IJ SOLN
INTRAMUSCULAR | Status: AC
  Filled 2015-04-21: qty 1

## 2015-04-21 MED ORDER — HYDROMORPHONE HCL 1 MG/ML IJ SOLN
0.2500 mg | INTRAMUSCULAR | Status: DC | PRN
  Administered 2015-04-21 (×2): 0.5 mg via INTRAVENOUS

## 2015-04-21 MED ORDER — CLINDAMYCIN PHOSPHATE 300 MG/50ML IV SOLN
300.0000 mg | Freq: Once | INTRAVENOUS | Status: AC
  Administered 2015-04-21: 300 mg via INTRAVENOUS

## 2015-04-21 MED ORDER — HYDROMORPHONE HCL 1 MG/ML IJ SOLN
INTRAMUSCULAR | Status: AC
  Filled 2015-04-21: qty 1

## 2015-04-21 MED ORDER — CLINDAMYCIN PHOSPHATE 300 MG/50ML IV SOLN
INTRAVENOUS | Status: AC
  Filled 2015-04-21: qty 50

## 2015-04-21 MED ORDER — LIDOCAINE HCL (CARDIAC) 20 MG/ML IV SOLN
INTRAVENOUS | Status: DC | PRN
  Administered 2015-04-21: 60 mg via INTRAVENOUS

## 2015-04-21 MED ORDER — SODIUM CHLORIDE 0.9 % IJ SOLN
INTRAVENOUS | Status: DC | PRN
  Administered 2015-04-21: 5 mL

## 2015-04-21 MED ORDER — MIDAZOLAM HCL 2 MG/2ML IJ SOLN
1.0000 mg | INTRAMUSCULAR | Status: DC | PRN
  Administered 2015-04-21: 1 mg via INTRAVENOUS

## 2015-04-21 MED ORDER — GLYCOPYRROLATE 0.2 MG/ML IJ SOLN: 0.2000 mg | Freq: Once | INTRAMUSCULAR | Status: DC | PRN

## 2015-04-21 MED ORDER — SCOPOLAMINE 1 MG/3DAYS TD PT72: 1.0000 | MEDICATED_PATCH | Freq: Once | TRANSDERMAL | Status: DC | PRN

## 2015-04-21 MED ORDER — KETOROLAC TROMETHAMINE 30 MG/ML IJ SOLN: 30.0000 mg | Freq: Once | INTRAMUSCULAR | Status: DC | PRN

## 2015-04-21 MED ORDER — DEXAMETHASONE SODIUM PHOSPHATE 4 MG/ML IJ SOLN
INTRAMUSCULAR | Status: DC | PRN
  Administered 2015-04-21: 10 mg via INTRAVENOUS

## 2015-04-21 MED ORDER — BUPIVACAINE HCL (PF) 0.5 % IJ SOLN
INTRAMUSCULAR | Status: DC | PRN
  Administered 2015-04-21: 30 mL via PERINEURAL

## 2015-04-21 MED ORDER — PROPOFOL 10 MG/ML IV BOLUS
INTRAVENOUS | Status: DC | PRN
  Administered 2015-04-21: 30 mg via INTRAVENOUS
  Administered 2015-04-21: 150 mg via INTRAVENOUS

## 2015-04-21 MED ORDER — HYDROCODONE-ACETAMINOPHEN 5-325 MG PO TABS: 1.0000 | ORAL_TABLET | Freq: Four times a day (QID) | ORAL | Status: DC | PRN

## 2015-04-21 MED ORDER — PROPOFOL 10 MG/ML IV BOLUS
INTRAVENOUS | Status: AC
  Filled 2015-04-21: qty 20

## 2015-04-21 MED ORDER — LIDOCAINE HCL (CARDIAC) 20 MG/ML IV SOLN
INTRAVENOUS | Status: AC
  Filled 2015-04-21: qty 5

## 2015-04-21 MED ORDER — ONDANSETRON HCL 4 MG/2ML IJ SOLN
INTRAMUSCULAR | Status: AC
  Filled 2015-04-21: qty 2

## 2015-04-21 MED ORDER — PROMETHAZINE HCL 25 MG/ML IJ SOLN
6.2500 mg | INTRAMUSCULAR | Status: DC | PRN
Start: 2015-04-21 — End: 2015-04-21

## 2015-04-21 MED ORDER — CHLORHEXIDINE GLUCONATE 4 % EX LIQD
1.0000 | Freq: Once | CUTANEOUS | Status: DC
Start: 2015-04-21 — End: 2015-04-21

## 2015-04-21 MED ORDER — FENTANYL CITRATE (PF) 100 MCG/2ML IJ SOLN
50.0000 ug | INTRAMUSCULAR | Status: AC | PRN
  Administered 2015-04-21: 25 ug via INTRAVENOUS
  Administered 2015-04-21 (×2): 50 ug via INTRAVENOUS
  Administered 2015-04-21 (×2): 25 ug via INTRAVENOUS

## 2015-04-21 MED ORDER — MIDAZOLAM HCL 2 MG/2ML IJ SOLN
INTRAMUSCULAR | Status: AC
  Filled 2015-04-21: qty 2

## 2015-04-21 MED ORDER — BUPIVACAINE-EPINEPHRINE 0.25% -1:200000 IJ SOLN
INTRAMUSCULAR | Status: DC | PRN
  Administered 2015-04-21: 10 mL

## 2015-04-21 SURGICAL SUPPLY — 66 items
APL SKNCLS STERI-STRIP NONHPOA (GAUZE/BANDAGES/DRESSINGS) ×1
APPLIER CLIP 9.375 MED OPEN (MISCELLANEOUS) ×3
APR CLP MED 9.3 20 MLT OPN (MISCELLANEOUS) ×1
BANDAGE ACE 6X5 VEL STRL LF (GAUZE/BANDAGES/DRESSINGS) ×1 IMPLANT
BENZOIN TINCTURE PRP APPL 2/3 (GAUZE/BANDAGES/DRESSINGS) ×3 IMPLANT
BLADE CLIPPER SURG (BLADE) IMPLANT
BLADE SURG 10 STRL SS (BLADE) ×1 IMPLANT
BLADE SURG 15 STRL LF DISP TIS (BLADE) ×1 IMPLANT
BLADE SURG 15 STRL SS (BLADE) ×3
CANISTER SUCT 1200ML W/VALVE (MISCELLANEOUS) ×3 IMPLANT
CHLORAPREP W/TINT 26ML (MISCELLANEOUS) ×3 IMPLANT
CLIP APPLIE 9.375 MED OPEN (MISCELLANEOUS) ×1 IMPLANT
CLOSURE WOUND 1/2 X4 (GAUZE/BANDAGES/DRESSINGS) ×1
COVER BACK TABLE 60X90IN (DRAPES) ×3 IMPLANT
COVER MAYO STAND STRL (DRAPES) ×3 IMPLANT
COVER PROBE W GEL 5X96 (DRAPES) ×3 IMPLANT
DECANTER SPIKE VIAL GLASS SM (MISCELLANEOUS) IMPLANT
DRAPE LAPAROSCOPIC ABDOMINAL (DRAPES) ×2 IMPLANT
DRAPE LAPAROTOMY 100X72 PEDS (DRAPES) ×1 IMPLANT
DRAPE UTILITY XL STRL (DRAPES) ×5 IMPLANT
DRSG TEGADERM 4X4.75 (GAUZE/BANDAGES/DRESSINGS) ×6 IMPLANT
ELECT COATED BLADE 2.86 ST (ELECTRODE) ×3 IMPLANT
ELECT REM PT RETURN 9FT ADLT (ELECTROSURGICAL) ×3
ELECTRODE REM PT RTRN 9FT ADLT (ELECTROSURGICAL) ×1 IMPLANT
GLOVE BIO SURGEON STRL SZ 6.5 (GLOVE) ×1 IMPLANT
GLOVE BIO SURGEONS STRL SZ 6.5 (GLOVE) ×1
GLOVE BIOGEL M 7.0 STRL (GLOVE) ×2 IMPLANT
GLOVE BIOGEL PI IND STRL 7.0 (GLOVE) IMPLANT
GLOVE BIOGEL PI IND STRL 7.5 (GLOVE) IMPLANT
GLOVE BIOGEL PI IND STRL 8 (GLOVE) ×1 IMPLANT
GLOVE BIOGEL PI INDICATOR 7.0 (GLOVE) ×2
GLOVE BIOGEL PI INDICATOR 7.5 (GLOVE) ×2
GLOVE BIOGEL PI INDICATOR 8 (GLOVE) ×2
GLOVE ECLIPSE 8.0 STRL XLNG CF (GLOVE) ×3 IMPLANT
GOWN STRL REUS W/ TWL LRG LVL3 (GOWN DISPOSABLE) ×2 IMPLANT
GOWN STRL REUS W/TWL LRG LVL3 (GOWN DISPOSABLE) ×9
HEMOSTAT SNOW SURGICEL 2X4 (HEMOSTASIS) ×2 IMPLANT
LIQUID BAND (GAUZE/BANDAGES/DRESSINGS) ×3 IMPLANT
NDL HYPO 25X1 1.5 SAFETY (NEEDLE) ×2 IMPLANT
NDL SAFETY ECLIPSE 18X1.5 (NEEDLE) ×1 IMPLANT
NEEDLE HYPO 18GX1.5 SHARP (NEEDLE) ×3
NEEDLE HYPO 25X1 1.5 SAFETY (NEEDLE) ×6 IMPLANT
NS IRRIG 1000ML POUR BTL (IV SOLUTION) ×3 IMPLANT
PACK BASIN DAY SURGERY FS (CUSTOM PROCEDURE TRAY) ×3 IMPLANT
PENCIL BUTTON HOLSTER BLD 10FT (ELECTRODE) ×3 IMPLANT
SLEEVE SCD COMPRESS KNEE MED (MISCELLANEOUS) ×2 IMPLANT
SPONGE GAUZE 4X4 12PLY STER LF (GAUZE/BANDAGES/DRESSINGS) IMPLANT
SPONGE LAP 4X18 X RAY DECT (DISPOSABLE) ×3 IMPLANT
STAPLER VISISTAT 35W (STAPLE) ×3 IMPLANT
STRIP CLOSURE SKIN 1/2X4 (GAUZE/BANDAGES/DRESSINGS) ×2 IMPLANT
SUT ETHILON 2 0 FS 18 (SUTURE) ×3 IMPLANT
SUT ETHILON 3 0 PS 1 (SUTURE) IMPLANT
SUT MNCRL AB 3-0 PS2 18 (SUTURE) ×2 IMPLANT
SUT MON AB 4-0 PC3 18 (SUTURE) ×3 IMPLANT
SUT SILK 2 0 SH (SUTURE) IMPLANT
SUT VIC AB 0 SH 27 (SUTURE) ×2 IMPLANT
SUT VIC AB 2-0 SH 27 (SUTURE) ×3
SUT VIC AB 2-0 SH 27XBRD (SUTURE) ×1 IMPLANT
SUT VICRYL 3-0 CR8 SH (SUTURE) ×3 IMPLANT
SYR 5ML LL (SYRINGE) ×3 IMPLANT
SYR CONTROL 10ML LL (SYRINGE) ×3 IMPLANT
TOWEL OR 17X24 6PK STRL BLUE (TOWEL DISPOSABLE) ×3 IMPLANT
TOWEL OR NON WOVEN STRL DISP B (DISPOSABLE) ×3 IMPLANT
TUBE CONNECTING 20'X1/4 (TUBING) ×1
TUBE CONNECTING 20X1/4 (TUBING) ×2 IMPLANT
YANKAUER SUCT BULB TIP NO VENT (SUCTIONS) ×3 IMPLANT

## 2015-04-21 NOTE — Discharge Instructions (Signed)
Sentinel Lymph Node Biopsy Sentinel lymph node biopsy is a procedure to identify, remove, and examine one or more lymph nodes for cancer. Lymph nodes are collections of tissue that help filter infections, cancer cells, and other waste substances from the bloodstream. Certain types of cancer can spread to nearby lymph nodes. The cancer usually spreads to one lymph node first, and then to others. The first lymph node that the cancer could spread to is called the sentinel lymph node. In some cases, there may be more than one sentinel lymph node. You may have this procedure to determine whether your cancer has spread and to help your health care provider plan treatment for you. If no cancer is found in the sentinel lymph node, it is very unlikely that the cancer has spread to any of the other lymph nodes. If cancer is found in the sentinel lymph node, your surgeon may remove additional lymph nodes for examination. LET [facility] CARE PROVIDER KNOW ABOUT:   Any allergies you have, including any history of problems with contrast dye.  All medicines you are taking, including vitamins, herbs, eye drops, creams, and over-the-counter medicines.  Previous problems you or members of your family have had with the use of anesthetics.  Any blood disorders you have.  Previous surgeries you have had.  Medical conditions you have. RISKS AND COMPLICATIONS  Generally, sentinel lymph node biopsy is a safe procedure. However, problems can occur and include:  Infection.  Bleeding.  Allergic reaction to the dye used for the procedure.  Staining of the skin where the dye is injected.  Damaged lymph vessels, causing a buildup of fluid (lymphedema).  Pain or bruising at the biopsy site. BEFORE THE PROCEDURE   If you smoke, stop smoking at least 2 weeks before the procedure. This will improve your health after the procedure and reduce your risk of getting an infection.  You may have blood tests to make sure  your blood clots normally.  Ask your health care provider about:  Changing or stopping your regular medicines. This is especially important if you are taking diabetes medicines or blood thinners.  Taking medicines such as aspirin and ibuprofen. These medicines can thin your blood. Do not take these medicines before your procedure if your health care provider asks you not to.  Do not eat or drink anything after midnight on the night before the procedure or as directed by your health care provider.  Plan to have someone take you home after the procedure. PROCEDURE   You will be given a medicine that makes you fall asleep (general anesthetic) or medicine that numbs the surgical area (local anesthetic).  Blue dye or a radioactive substance or both will be injected around the tumor.  The blue dye will reach your lymph node quickly. It may be given just before surgery.  The radioactive substance will take longer to reach your lymph nodes, so it may be given before you go into the operating area.  Both the dye and the radioactive substance will follow the same path that a spreading cancer would be likely to follow.  If a radioactive substance is injected, a scanner will show where the substance has spread to help identify the sentinel lymph node.  The surgeon will make a small incision. If blue dye is injected, your surgeon will look for any lymph nodes that have picked up the dye.  Sentinel lymph nodes will be removed and sent to a lab for examination.  If no cancer  is found, no other lymph nodes will be removed. This means it is unlikely the cancer has spread to other lymph nodes.  If cancer is found, the surgeon will remove other lymph nodes for examination. This may happen during the same procedure or at a later time.  The incision will be closed with stitches or metal clips.  Small adhesive bandages may be used to keep the skin edges close together.  A small dressing may be taped  over the incision area. AFTER THE PROCEDURE   You will go to a recovery room.  Your urine may be blue for the next 24 hours. This is normal. It is caused by the dye used during the procedure.  Your skin at the injection site may be blue for up to 8 weeks.  You may feel numbness, tingling, or pain near your incision.  You may have swelling or bruising near your incision.   This information is not intended to replace advice given to you by your health care provider. Make sure you discuss any questions you have with your health care provider.   Document Released: 05/21/2011 Document Revised: 03/19/2014 Document Reviewed: 04/17/2013 Elsevier Interactive Patient Education 2016 [place] Lymph Node Biopsy, Care After Refer to this sheet in the next few weeks. These instructions provide you with information on caring for yourself after your procedure. Your health care provider may also give you more specific instructions. Your treatment has been planned according to current medical practices, but problems sometimes occur. Call your health care provider if you have any problems or questions after your procedure. WHAT TO EXPECT AFTER THE PROCEDURE After your procedure, it is typical to have the following:   Your urine may be blue for the next 24 hours. This is normal. It is caused by the dye used during the procedure.  Your skin at the injection site may be blue for up to 8 weeks.  You may feel numbness, tingling, or pain near your surgical incision.  You may have swelling or bruising near your incision. HOME CARE INSTRUCTIONS  Avoid vigorous exercise. Ask your health care provider when you can return to your normal activities.  You may shower 24 hours after your procedure. It is okay to get your incision wet. Pat the area dry with a clean towel. Do not rub the incision because this may cause bleeding.  Women who are given a surgical bra should wear it for the next 48 hours.  The bra may be removed to shower.  There are many different ways to close and cover an incision, including stitches, skin glue, and adhesive strips. Follow your health care provider's instructions on:  Incision care.  Bandage (dressing) changes and removal.  Incision closure removal.  Take medicines only as directed by your health care provider.  You may resume your regular diet.  Do not have your blood pressure taken in the arm on the side of the biopsy until your health care provider says it is okay.  Keep all follow-up visits as directed by your health care provider. This is important. SEEK MEDICAL CARE IF:  Your pain medicine is not helping.  You have nausea and vomiting.  You have any new swelling, bruising, or redness.  You have chills or a fever. SEEK IMMEDIATE MEDICAL CARE IF:   You have pain that is getting worse, and your medicine is not helping.  You have redness, swelling, or tenderness that is getting worse.  You have bleeding or drainage  from your incision site.  You have vomiting that will not stop.  You have chest pain or trouble breathing.   This information is not intended to replace advice given to you by your health care provider. Make sure you discuss any questions you have with your health care provider.   Document Released: 10/11/2003 Document Revised: 03/19/2014 Document Reviewed: 04/17/2013 Elsevier Interactive Patient Education 2016 [place] is a form of skin cancer that begins in melanocytes. Melanocytes are the skin cells that produce pigment. Melanoma starts as a mole on the skin and can spread to other parts of the body. If found early, many cases of melanoma are curable.  CAUSES  The exact cause is unknown.  RISK FACTORS  Spending a lot of time in the sun, under a sunlamp, or in a tanning booth.  Having sunburn that blisters. The more blistering sunburns you have, the higher your risk of melanoma.  Spending  time in parts of the world with more intense sunlight.  Living in a hot, sunny climate.  Having fair skin that does not tan easily.  Having had melanoma before.  Having a family history of melanoma.  Having more than 100 skin moles. SIGNS AND SYMPTOMS   ABCDE changes in a mole. ABCDE stands for:   Asymmetry. This means the mole has an irregular shape. It is not round or oval.  Border. This means the mole has an irregular or bumpy border.  Color. This means the mole has multiple colors in it, including brown, black, blue, red, or tan.  Diameter. This means the mole is more than 0.2 in (6 mm) across.  Evolving. This refers to any unusual changes or symptoms in the mole, such as pain, itching, stinging, sensitivity, or bleeding.  A new mole.  Swollen lymph nodes.  Shortness of breath.  Bone pain.  Headache.  Seizures.  Visual problems. DIAGNOSIS  Your health care provider will take a tissue sample from the mole to examine under a microscope (biopsy). The biopsy will reveal whether melanoma has spread to deeper layers of the skin. Your health care provider will also order tests, including:  Blood tests.  Chest X-rays.  A CT scan.  A bone scan. TREATMENT  You will have surgery to remove the cancer. Your lymph nodes may also be removed during the surgery. If melanoma has spread to other organs, such as the liver, lungs, bone, or brain, you will need additional treatment.  PREVENTION  Melanoma may come back (recur) after it has been treated. Your risk of recurrence is higher if you had thick or ulcerated tumors or patches of tumors. Generally, the more advanced your melanoma, the more likely it will recur. You can help prevent melanoma from recurring by staying out of the sun, especially during peak midafternoon hours. When you are outdoors or in the sun:  Wear long sleeves, a hat, and sunglasses that block UV light when possible.  Apply a sunscreen with an SPF of 30  or higher regularly. Take these precautions on cloudy days and in the winter, even if you will be outdoors for only a short period of time. SEEK MEDICAL CARE IF:  You notice any new growths or changes in your skin.  You have had melanoma removed and you notice a new growth in the same location.   This information is not intended to replace advice given to you by your health care provider. Make sure you discuss any questions you have with your  health care provider.   Document Released: 02/26/2005 Document Revised: 03/19/2014 Document Reviewed: 04/22/2013 Elsevier Interactive Patient Education 2016 [place] Anesthesia Home Care Instructions  Activity: Get plenty of rest for the remainder of the day. A responsible adult should stay with you for 24 hours following the procedure.  For the next 24 hours, DO NOT: -Drive a car -Paediatric nurse -Drink alcoholic beverages -Take any medication unless instructed by your physician -Make any legal decisions or sign important papers.  Meals: Start with liquid foods such as gelatin or soup. Progress to regular foods as tolerated. Avoid greasy, spicy, heavy foods. If nausea and/or vomiting occur, drink only clear liquids until the nausea and/or vomiting subsides. Call your physician if vomiting continues.  Special Instructions/Symptoms: Your throat may feel dry or sore from the anesthesia or the breathing tube placed in your throat during surgery. If this causes discomfort, gargle with warm salt water. The discomfort should disappear within 24 hours.  If you had a scopolamine patch placed behind your ear for the management of post- operative nausea and/or vomiting:  1. The medication in the patch is effective for 72 hours, after which it should be removed.  Wrap patch in a tissue and discard in the trash. Wash hands thoroughly with soap and water. 2. You may remove the patch earlier than 72 hours if you experience unpleasant side  effects which may include dry mouth, dizziness or visual disturbances. 3. Avoid touching the patch. Wash your hands with soap and water after contact with the patch.

## 2015-04-21 NOTE — Interval H&P Note (Signed)
History and Physical Interval Note:  04/21/2015 12:17 PM  [name]  has presented today for surgery, with the diagnosis of Melanoma right shoulder   The various methods of treatment have been discussed with the patient and family. After consideration of risks, benefits and other options for treatment, the patient has consented to  Procedure(s): MELANOMA EXCISION RIGHT SHOULDER WITH SENTINEL LYMPH NODE BIOPSY (Right) as a surgical intervention .  The patient's history has been reviewed, patient examined, no change in status, stable for surgery.  I have reviewed the patient's chart and labs.  Questions were answered to the patient's satisfaction.   Sentinel lymph node mapping and dissection has been discussed with the patient.  Risk of bleeding,  Infection,  Seroma formation,  Additional procedures,,  Shoulder weakness ,  Shoulder stiffness,  Nerve and blood vessel injury and reaction to the mapping dyes have been discussed.  Alternatives to surgery have been discussed with the patient.  The patient agrees to proceed.The procedure has been discussed with the patient.  Alternative therapies have been discussed with the patient.  Operative risks include bleeding,  Infection,  Organ injury,  Nerve injury,  Blood vessel injury,  DVT,  Pulmonary embolism,  Death,  And possible reoperation.  Medical management risks include worsening of present situation.  The success of the procedure is 50 -90 % at treating patients symptoms.  The patient understands and agrees to proceed.  [name]

## 2015-04-21 NOTE — Anesthesia Preprocedure Evaluation (Signed)
Anesthesia Evaluation  Patient identified by MRN, date of birth, ID band Patient awake    Reviewed: Allergy & Precautions, NPO status , Patient's Chart, lab work & pertinent test results  Airway Mallampati: II  TM Distance: >3 FB Neck ROM: Full    Dental no notable dental hx.    Pulmonary neg pulmonary ROS, former smoker,    Pulmonary exam normal breath sounds clear to auscultation       Cardiovascular hypertension, Pt. on medications Normal cardiovascular exam Rhythm:Regular Rate:Normal     Neuro/Psych negative neurological ROS  negative psych ROS   GI/Hepatic negative GI ROS, Neg liver ROS,   Endo/Other  Hypothyroidism   Renal/GU negative Renal ROS  negative genitourinary   Musculoskeletal negative musculoskeletal ROS (+)   Abdominal   Peds negative pediatric ROS (+)  Hematology negative hematology ROS (+)   Anesthesia Other Findings   Reproductive/Obstetrics negative OB ROS                             Anesthesia Physical Anesthesia Plan  ASA: II  Anesthesia Plan: General   Post-op Pain Management:    Induction: Intravenous  Airway Management Planned: LMA and Oral ETT  Additional Equipment:   Intra-op Plan:   Post-operative Plan: Extubation in OR  Informed Consent: I have reviewed the patients History and Physical, chart, labs and discussed the procedure including the risks, benefits and alternatives for the proposed anesthesia with the patient or authorized representative who has indicated his/her understanding and acceptance.   Dental advisory given  Plan Discussed with: CRNA and Surgeon  Anesthesia Plan Comments:         Anesthesia Quick Evaluation

## 2015-04-21 NOTE — Anesthesia Postprocedure Evaluation (Signed)
Anesthesia Post Note  Patient: [name]  Procedure(s) Performed: Procedure(s) (LRB): MELANOMA EXCISION RIGHT SHOULDER WITH SENTINEL LYMPH NODE BIOPSY (Right)  Patient location during evaluation: PACU Anesthesia Type: General and Regional Level of consciousness: awake and alert Pain management: pain level controlled Vital Signs Assessment: post-procedure vital signs reviewed and stable Respiratory status: spontaneous breathing, nonlabored ventilation, respiratory function stable and patient connected to nasal cannula oxygen Cardiovascular status: blood pressure returned to baseline and stable Postop Assessment: no signs of nausea or vomiting Anesthetic complications: no    Last Vitals:  Filed Vitals:   04/21/15 1430 04/21/15 1445  BP: 160/64 147/65  Pulse: 48 52  Temp:    Resp: 10 11    Last Pain:  Filed Vitals:   04/21/15 1500  PainSc: 5                  Lyall Faciane S

## 2015-04-21 NOTE — Op Note (Signed)
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  Anesthesia: LMA with 0.25% Sensorcaine local with epinephrine  EBL: Minimal  Specimens: Wide excision right shoulder melanoma and 2 right cervical sentinel lymph nodes  Drains: None  Indications for procedure: Patient presents with stage II superficial spreading melanoma of right shoulder. Her death was 1.1 mm Clark level IV. Discussed rationale wide local excision as well as sentinel lymph node mapping given the intermediate achiness all this melanoma. We discussed risks of the procedure to include bleeding, infection, cosmetic deformity, the need for lymph node biopsy and subsequent lymphedema, possibility of neck exploration if the isotope taken up to the cervical lymph node chain, injury to structures within it, injury to major blood vessels, injury to nerves, arm weakness or loss of function,   possible dysfunction of upper extremity and/or weakness, and the need for other operative procedures. Sentinel lymph node mapping and dissection has been discussed with the patient.  Risk of bleeding,  Infection,  Seroma formation,  Additional procedures,,  Shoulder weakness ,  Shoulder stiffness,  Nerve and blood vessel injury and reaction to the mapping dyes have been discussed.  Alternatives to surgery have been discussed with the patient.  The patient agrees to proceed.  Description of procedure: The patient was met in the holding area and questions were answered. She received injection of her right shoulder melanoma on technetium Soffer colloid by nuclear medicine. I marked the area encircled it from the previous excision. Questions were answered. I discussed the possibility of having to explore her neck as well as her axilla depending on the path of the isotope tracer. Preop  lymphoscintigraphy was somewhat vague and showed uptake possibly in the axilla or right shoulder region. I explained to her family that sometimes he isotope will drain into the neck region as well and I would check this after induction of general anesthesia. She was taken back the operating room and placed supine. After induction of general anesthesia the neoprobe was used and she had significant uptake just lateral to the sternocleidomastoid muscle over the right clavicle. There is no significant activity in her right axilla. I injected 4 mL of methylene blue dye under the melanoma site under sterile conditions. She was prepped and draped in a sterile fashion. Timeout was done. Proper procedure and patient were verified. She received 2 g of Ancef. The melanoma region was widely excised to a 1 cm margin. Excision was taken only down to the fascia over the deltoid muscle. The ellipse of skin and subcutaneous fat were removed. We was made hemostatic and closed with a deep layer of 0 Vicryl and 3-0 Monocryl.  Neoprobe was used to verify hotspot which was just lateral to the sternocleidomastoid border just of the clavicle. Incision was made after infiltration of local anesthesia. The platysma muscles were divided in the external jugular vein was visualized. With elbow the neoprobe there were significant counts just behind the external jugular vein. I controlled this with vessel loop Retracted out of the field. Careful dissection was used and 2 sentinel nodes identified both along the path of the external jugular vein.  These were carefully dissected away and removed. Care was taken to stay away from the brachial plexus, common carotid artery, esophagus, phrenic nerve, and and ansa cervicalis. Upon inspection field static. There is no evidence of any air leakage, damage the pleura,  or damage to the subclavian vessels and/or brachial plexus I can see. The nerve was out of the operative field we were well away from that.  He was irrigated. Surgicel snow was placed the wound. The vessel was removed and the external jugular vein was placed back in its native bed. We then closed the latissimus muscle with 3-0 Vicryl. 4 Monocryl was used to close skin in a subcuticular fashion. A liquid adhesive applied to both incisions. All final counts found to be correct. The patient was awoke, extubated taken to recovery in satisfactory condition.

## 2015-04-21 NOTE — Progress Notes (Signed)
Radiology staff performed nuc med inj. Pt tol well with no additional sedation required. VSS (see flowsheet).  Will retrieve family from lobby and update/provide emotional support.

## 2015-04-21 NOTE — H&P (View-Only) (Signed)
[name] 03/24/2015 2:41 PM Location: [facility] Patient #: [id number] DOB: Feb 27, 1946 Undefined / Language: [name] / Race: White Female  History of Present Illness [name] Moores A. Ellwood Steidle MD; 03/24/2015 3:36 PM) Patient words: right shoulder melanoma/ needs SLN biopsy   Pt sent at the request of Dr Ubaldo Glassing for right shoulder melanoma. She has a personal hx of melanoma in the past.   path shows superficial spreading melanoma  Clark level 4 1.1 cm thickness   Denies complaints.  The patient is a 70 year old female.   Other Problems [name], CMA; 03/24/2015 2:42 PM) Back Pain Diabetes Mellitus Gastroesophageal Reflux Disease High blood pressure Melanoma Thyroid Disease  Past Surgical History [name], [place]; 03/24/2015 2:42 PM) Appendectomy Cesarean Section - Multiple Hysterectomy (not due to cancer) - Complete  Diagnostic Studies History [name], [place]; 03/24/2015 2:42 PM) Colonoscopy 1-5 years ago Mammogram within last year  Allergies [name], CMA; 03/24/2015 2:43 PM) Penicillin G Pot in Dextrose *PENICILLINS*  Medication History [name], CMA; 03/24/2015 2:44 PM) Calcipotriene-Betameth Diprop (0.005-0.064% Ointment, External) Active. Synthroid (100MCG Tablet, Oral) Active. Lisinopril-Hydrochlorothiazide (10-12.5MG  Tablet, Oral) Active. Montelukast Sodium (10MG  Tablet, Oral) Active. Finacea (15% Gel, External) Active. Aspirin (81MG  Tablet DR, Oral) Active. Calcium Carbonate-Vit D-Min (600-200MG -UNIT Tablet, Oral) Active. Medications Reconciled  Pregnancy / Birth History [name], [place]; 03/24/2015 2:42 PM) Age at menarche 59 years. Age of menopause <47 Gravida 4 Maternal age 81-30 Para 2     Review of Systems [name] CMA; 03/24/2015 2:42 PM) General Not Present- Appetite Loss, Chills, Fatigue, Fever, Night Sweats, Weight Gain and Weight Loss. Skin Not Present-  Change in Wart/Mole, Dryness, Hives, Jaundice, New Lesions, Non-Healing Wounds, Rash and Ulcer. HEENT Present- Seasonal Allergies and Wears glasses/contact lenses. Not Present- Earache, Hearing Loss, Hoarseness, Nose Bleed, Oral Ulcers, Ringing in the Ears, Sinus Pain, Sore Throat, Visual Disturbances and Yellow Eyes. Respiratory Not Present- Bloody sputum, Chronic Cough, Difficulty Breathing, Snoring and Wheezing. Breast Not Present- Breast Mass, Breast Pain, Nipple Discharge and Skin Changes. Cardiovascular Not Present- Chest Pain, Difficulty Breathing Lying Down, Leg Cramps, Palpitations, Rapid Heart Rate, Shortness of Breath and Swelling of Extremities. Gastrointestinal Present- Hemorrhoids. Not Present- Abdominal Pain, Bloating, Bloody Stool, Change in Bowel Habits, Chronic diarrhea, Constipation, Difficulty Swallowing, Excessive gas, Gets full quickly at meals, Indigestion, Nausea, Rectal Pain and Vomiting. Female Genitourinary Not Present- Frequency, Nocturia, Painful Urination, Pelvic Pain and Urgency. Musculoskeletal Not Present- Back Pain, Joint Pain, Joint Stiffness, Muscle Pain, Muscle Weakness and Swelling of Extremities. Neurological Not Present- Decreased Memory, Fainting, Headaches, Numbness, Seizures, Tingling, Tremor, Trouble walking and Weakness. Psychiatric Not Present- Anxiety, Bipolar, Change in Sleep Pattern, Depression, Fearful and Frequent crying. Endocrine Not Present- Cold Intolerance, Excessive Hunger, Hair Changes, Heat Intolerance, Hot flashes and New Diabetes. Hematology Present- Easy Bruising. Not Present- Excessive bleeding, Gland problems, HIV and Persistent Infections.  Vitals [name] CMA; 03/24/2015 2:41 PM) 03/24/2015 2:41 PM Weight: 154 lb Height: 59in Body Surface Area: 1.65 m Body Mass Index: 31.1 kg/m  Temp.: 98.39F(Oral)  BP: 132/78 (Sitting, Left Arm, Standard)      Physical Exam ([name] Maack A. Aspynn Clover MD; 03/24/2015 3:37  PM)  Integumentary Note: 1 cm incision right shoulder clean sutures intact  Chest and Lung Exam Chest and lung exam reveals -quiet, even and easy respiratory effort with no use of accessory muscles and on auscultation, normal breath sounds, no adventitious sounds and normal vocal resonance. Inspection Chest Wall - Normal.  Back - normal.  Cardiovascular Cardiovascular examination reveals -on palpation PMI is normal in location and amplitude, no palpable S3 or S4. Normal cardiac borders., normal heart sounds, regular rate and rhythm with no murmurs, carotid auscultation reveals no bruits and normal pedal pulses bilaterally.  Lymphatic Head & Neck  General Head & Neck Lymphatics: Bilateral - Description - Normal. Axillary  General Axillary Region: Bilateral - Description - Normal. Tenderness - Non Tender.    Assessment & Plan ([name] Hitt A. Kutter Schnepf MD; 03/24/2015 3:37 PM)  MELANOMA OF SHOULDER (C43.60) Impression: right shoulder melanoma T2NX Superficial spreading 1.1 mm thickness  Recommend wide excision to 1 cm margin and SLN mapping  Risk of sentinel lymph node mapping include bleeding, infection, lymphedema, shoulder pain. stiffness, dye allergy. cosmetic deformity , blood clots, death, need for more surgery. Pt agres to proceed.  Discussed risks of infection cosmetic deformity shoulder stiffness and adjacent organ injury she will need preop lymphscintigraphy she agrees to proceed  Current Plans The anatomy and the physiology was discussed. The pathophysiology and natural history of the disease was discussed. Options were discussed and recommendations were made. Technique, risks, benefits, & alternatives were discussed. Risks such as stroke, heart attack, bleeding, indection, death, and other risks discussed. Questions answered. The patient agrees to proceed. The pathophysiology of skin & subcutaneous masses was discussed. Natural history risks without surgery were  discussed. I recommended surgery to remove the mass. I explained the technique of removal with use of local anesthesia & possible need for more aggressive sedation/anesthesia for patient comfort.  Risks such as bleeding, infection, wound breakdown, heart attack, death, and other risks were discussed. I noted a good likelihood this will help address the problem. Possibility that this will not correct all symptoms was explained. Possibility of regrowth/recurrence of the mass was discussed. We will work to minimize complications. Questions were answered. The patient expresses understanding & wishes to proceed with surgery.  Pt Education - [facility] Free Text Education/Instructions: discussed with patient and provided information.

## 2015-04-21 NOTE — Progress Notes (Signed)
Assisted Dr. Rose with right, ultrasound guided, pectoralis block. Side rails up, monitors on throughout procedure. See vital signs in flow sheet. Tolerated Procedure well. 

## 2015-04-21 NOTE — Anesthesia Procedure Notes (Addendum)
Anesthesia Regional Block:  Interscalene brachial plexus block  Pre-Anesthetic Checklist: ,, timeout performed, Correct Patient, Correct Site, Correct Laterality, Correct Procedure, Correct Position, site marked, Risks and benefits discussed,  Surgical consent,  Pre-op evaluation,  At surgeon's request and post-op pain management  Laterality: Right  Prep: chloraprep       Needles:  Injection technique: Single-shot  Needle Type: Echogenic Needle     Needle Length: 9cm 9 cm Needle Gauge: 21 and 21 G    Additional Needles:  Procedures: ultrasound guided (picture in chart) Interscalene brachial plexus block Narrative:  Injection made incrementally with aspirations every 5 mL.  Performed by: Personally  Anesthesiologist: ROSE, Iona Beard  Additional Notes: Patient tolerated the procedure well without complications   Procedure Name: LMA Insertion Date/Time: 04/21/2015 12:45 PM Performed by: Maryella Shivers Pre-anesthesia Checklist: Patient identified, Emergency Drugs available, Suction available and Patient being monitored Patient Re-evaluated:Patient Re-evaluated prior to inductionOxygen Delivery Method: Circle System Utilized Preoxygenation: Pre-oxygenation with 100% oxygen Intubation Type: IV induction Ventilation: Mask ventilation without difficulty LMA: LMA inserted LMA Size: 4.0 Number of attempts: 1 Airway Equipment and Method: Bite block Placement Confirmation: positive ETCO2 Tube secured with: Tape Dental Injury: Teeth and Oropharynx as per pre-operative assessment

## 2015-04-21 NOTE — Transfer of Care (Signed)
Immediate Anesthesia Transfer of Care Note  Patient: [name]  Procedure(s) Performed: Procedure(s) with comments: MELANOMA EXCISION RIGHT SHOULDER WITH SENTINEL LYMPH NODE BIOPSY (Right) -  Sentinel node mapping right neck  Patient Location: PACU  Anesthesia Type:General  Level of Consciousness: sedated  Airway & Oxygen Therapy: Patient Spontanous Breathing and Patient connected to face mask oxygen  Post-op Assessment: Report given to RN and Post -op Vital signs reviewed and stable  Post vital signs: Reviewed and stable  Last Vitals:  Filed Vitals:   04/21/15 1215 04/21/15 1411  BP: 123/60   Pulse: 48 54  Temp:    Resp: 15     Complications: No apparent anesthesia complications

## 2015-04-22 ENCOUNTER — Encounter (HOSPITAL_BASED_OUTPATIENT_CLINIC_OR_DEPARTMENT_OTHER): Payer: Self-pay | Admitting: Surgery

## 2015-05-02 ENCOUNTER — Other Ambulatory Visit: Payer: Self-pay | Admitting: Surgery

## 2015-05-02 DIAGNOSIS — C439 Malignant melanoma of skin, unspecified: Secondary | ICD-10-CM

## 2015-05-04 ENCOUNTER — Telehealth: Payer: Self-pay | Admitting: Internal Medicine

## 2015-05-04 NOTE — Telephone Encounter (Signed)
Pt aware of np appt on 05/17/15@1 :27 Referring Dr. Brantley Stage Dx-Melanoma Pt's husband is a pt of Dr. Julien Nordmann

## 2015-05-11 DIAGNOSIS — E119 Type 2 diabetes mellitus without complications: Secondary | ICD-10-CM | POA: Diagnosis not present

## 2015-05-11 DIAGNOSIS — E038 Other specified hypothyroidism: Secondary | ICD-10-CM | POA: Diagnosis not present

## 2015-05-11 DIAGNOSIS — E784 Other hyperlipidemia: Secondary | ICD-10-CM | POA: Diagnosis not present

## 2015-05-11 DIAGNOSIS — M859 Disorder of bone density and structure, unspecified: Secondary | ICD-10-CM | POA: Diagnosis not present

## 2015-05-12 ENCOUNTER — Telehealth: Payer: Self-pay | Admitting: Internal Medicine

## 2015-05-12 NOTE — Telephone Encounter (Signed)
Called pt did the pre-registration.

## 2015-05-17 ENCOUNTER — Other Ambulatory Visit: Payer: Self-pay | Admitting: *Deleted

## 2015-05-17 ENCOUNTER — Other Ambulatory Visit (HOSPITAL_BASED_OUTPATIENT_CLINIC_OR_DEPARTMENT_OTHER): Payer: Medicare Other

## 2015-05-17 ENCOUNTER — Ambulatory Visit (HOSPITAL_BASED_OUTPATIENT_CLINIC_OR_DEPARTMENT_OTHER): Payer: Medicare Other | Admitting: Internal Medicine

## 2015-05-17 ENCOUNTER — Encounter: Payer: Self-pay | Admitting: Internal Medicine

## 2015-05-17 ENCOUNTER — Telehealth: Payer: Self-pay | Admitting: Internal Medicine

## 2015-05-17 VITALS — BP 133/65 | HR 59 | Temp 98.0°F | Resp 16 | Ht 60.0 in | Wt 150.5 lb

## 2015-05-17 DIAGNOSIS — C4361 Malignant melanoma of right upper limb, including shoulder: Secondary | ICD-10-CM

## 2015-05-17 DIAGNOSIS — R079 Chest pain, unspecified: Secondary | ICD-10-CM | POA: Diagnosis not present

## 2015-05-17 DIAGNOSIS — C439 Malignant melanoma of skin, unspecified: Secondary | ICD-10-CM

## 2015-05-17 LAB — COMPREHENSIVE METABOLIC PANEL
ALT: 28 U/L (ref 0–55)
AST: 27 U/L (ref 5–34)
Albumin: 3.9 g/dL (ref 3.5–5.0)
Alkaline Phosphatase: 73 U/L (ref 40–150)
Anion Gap: 8 mEq/L (ref 3–11)
BUN: 21.1 mg/dL (ref 7.0–26.0)
CHLORIDE: 102 meq/L (ref 98–109)
CO2: 30 meq/L — AB (ref 22–29)
CREATININE: 0.7 mg/dL (ref 0.6–1.1)
Calcium: 10 mg/dL (ref 8.4–10.4)
EGFR: 90 mL/min/{1.73_m2} — ABNORMAL LOW (ref >90)
GLUCOSE: 89 mg/dL (ref 70–140)
Potassium: 4.2 mEq/L (ref 3.5–5.1)
Sodium: 139 mEq/L (ref 136–145)
Total Bilirubin: 0.65 mg/dL (ref 0.20–1.20)
Total Protein: 7.3 g/dL (ref 6.4–8.3)

## 2015-05-17 LAB — CBC WITH DIFFERENTIAL/PLATELET
BASO%: 0.4 % (ref 0.0–2.0)
BASOS ABS: 0 10*3/uL (ref 0.0–0.1)
EOS%: 1.8 % (ref 0.0–7.0)
Eosinophils Absolute: 0.2 10*3/uL (ref 0.0–0.5)
HEMATOCRIT: 43.1 % (ref 34.8–46.6)
HGB: 14.7 g/dL (ref 11.6–15.9)
LYMPH%: 38.9 % (ref 14.0–49.7)
MCH: 31.5 pg (ref 25.1–34.0)
MCHC: 34.1 g/dL (ref 31.5–36.0)
MCV: 92.3 fL (ref 79.5–101.0)
MONO#: 0.8 10*3/uL (ref 0.1–0.9)
MONO%: 7.2 % (ref 0.0–14.0)
NEUT#: 5.4 10*3/uL (ref 1.5–6.5)
NEUT%: 51.7 % (ref 38.4–76.8)
Platelets: 199 10*3/uL (ref 145–400)
RBC: 4.67 10*6/uL (ref 3.70–5.45)
RDW: 13.4 % (ref 11.2–14.5)
WBC: 10.5 10*3/uL — ABNORMAL HIGH (ref 3.9–10.3)
lymph#: 4.1 10*3/uL — ABNORMAL HIGH (ref 0.9–3.3)

## 2015-05-17 NOTE — Progress Notes (Signed)
[facility] [phone]   [phone]  CONSULT NOTE  REFERRING PHYSICIAN: Dr. Marcello Moores Cornett  REASON FOR CONSULTATION:  70 years old white female recently diagnosed with malignant melanoma.  HPI [name] is a 70 y.o. female with past medical history significant for diabetes mellitus, hypertension, GERD, hypothyroidism, and chronic back pain. She also has a history of early-stage melanoma status post excision from the back in 2002. The patient was seen by her dermatologist Dr. Ubaldo Glassing for routine evaluation and skin exam. She was noted to have a suspicious lesion on the right shoulder area. She underwent shave biopsy and it was consistent with superficially spreading malignant melanoma with Clark's level IV and breslow sickness of 1.1 mm. There was no evidence for lymphovascular invasion or regression. The patient was referred to Dr. Brantley Stage. On 06/19/2015 she underwent wide excision of the right shoulder melanoma as well as right cervical sentinel lymph node mapping with excision of 2 right cervical sentinel lymph nodes. The final pathology (Accession: [id number]) showed no residual malignant melanoma and the excision margin was free of melanoma. The dissected sentinel lymph nodes were negative for malignancy. The mitotic index was more than 1/MM2 with absent irrigation and negative lymphovascular invasion. The patient is feeling fine and recovering well from her recent surgery. She complained today of persistent pain on the right side of the chest under her right breast with radiation to the back. She denied having any significant shortness of breath, cough or hemoptysis. She denied having any significant nausea or vomiting. She has no headache or visual changes. She intentionally lost few pounds recently and also has occasional night sweats. Family history significant for mother died from heart disease, father died from stroke and heart disease, brother had  colon, lung and pancreatic cancer. She also has a sister with MDS. The patient is married and has 2 children. She is currently retired and used to work in a Engineer, technical sales. She has no history of smoking, alcohol or drug abuse.   HPI  Past Medical History  Diagnosis Date  . Hypothyroidism   . Psoriasis   . Hypertension   . Environmental allergies   . Allergy     seasonal, pcn  . Cancer ([facility]) 2011, 2017    melanoma on back, rt shoulder  . Rosacea   . PONV (postoperative nausea and vomiting)     Past Surgical History  Procedure Laterality Date  . Ectopic pregnancy surgery  1974  . Dilation and curettage of uterus  1974  . Abdominal hysterectomy  1984  . Lumbar disc surgery  1977  . Carpal tunnel release  1986  . Melanoma excision  11-2009  . Back surgery      lumbar  . Tonsillectomy    . Diagnostic laparoscopy    . Appendectomy    . Cesarean section      x2  . Melanoma excision with sentinel lymph node biopsy Right 04/21/2015    Procedure: MELANOMA EXCISION RIGHT SHOULDER WITH SENTINEL LYMPH NODE BIOPSY;  Surgeon: Erroll Luna, MD;  Location: [facility];  Service: General;  Laterality: Right;   Sentinel node mapping right neck    Family History  Problem Relation Age of Onset  . Colon cancer Brother   . Liver disease Brother   . Pancreatic cancer Brother   . Heart disease Mother   . Anemia Mother   . Heart disease Father     Social History Social History  Substance  Use Topics  . Smoking status: Former Research scientist (life sciences)  . Smokeless tobacco: Never Used  . Alcohol Use: No    Allergies  Allergen Reactions  . Biaxin [Clarithromycin] Nausea Only  . Penicillins     Unknown reaction  . Sulfamethoxazole Nausea Only  . Soriatane [Acitretin] Rash    Current Outpatient Prescriptions  Medication Sig Dispense Refill  . aspirin EC 81 MG tablet Take 81 mg by mouth daily.    . calcipotriene-betamethasone (TACLONEX) ointment Apply topically 2 (two) times daily. For  6 weeks    . Calcium Carbonate-Vitamin D (CALTRATE 600+D PO) Take by mouth.    . doxycycline (DORYX) 100 MG EC tablet Take 100 mg by mouth 2 (two) times daily.    Marland Kitchen FINACEA 15 % cream     . HYDROcodone-acetaminophen (NORCO) 5-325 MG tablet Take 1-2 tablets by mouth every 6 (six) hours as needed for moderate pain. 30 tablet 0  . levothyroxine (SYNTHROID, LEVOTHROID) 100 MCG tablet Take 100 mcg by mouth daily before breakfast.    . lisinopril-hydrochlorothiazide (PRINZIDE,ZESTORETIC) 10-12.5 MG per tablet Take 1 tablet by mouth daily.    . montelukast (SINGULAIR) 10 MG tablet Take 10 mg by mouth daily.    Glory Rosebush DELICA LANCETS [id number] MISC TEST SUGAR ONCE D  4  . ONETOUCH VERIO test strip TEST ONCE D  3  . triamcinolone cream (KENALOG) 0.1 %      No current facility-administered medications for this visit.    Review of Systems  Constitutional: positive for weight loss Eyes: negative Ears, nose, mouth, throat, and face: negative Respiratory: positive for pleurisy/chest pain Cardiovascular: negative Gastrointestinal: negative Genitourinary:negative Integument/breast: negative Hematologic/lymphatic: negative Musculoskeletal:negative Neurological: negative Behavioral/Psych: negative Endocrine: negative Allergic/Immunologic: negative  Physical Exam  [id number], healthy, no distress, well nourished and well developed SKIN: skin color, texture, turgor are normal, no rashes or significant lesions HEAD: Normocephalic, No masses, lesions, tenderness or abnormalities EYES: normal, PERRLA, Conjunctiva are pink and non-injected EARS: External ears normal, Canals clear OROPHARYNX:no exudate, no erythema and lips, buccal mucosa, and tongue normal  NECK: supple, no adenopathy, no JVD LYMPH:  no palpable lymphadenopathy, no hepatosplenomegaly BREAST:not examined LUNGS: clear to auscultation , and palpation HEART: regular rate & rhythm, no murmurs and no gallops ABDOMEN:abdomen soft,  non-tender, normal bowel sounds and no masses or organomegaly BACK: Back symmetric, no curvature., No CVA tenderness EXTREMITIES:no joint deformities, effusion, or inflammation, no edema, no skin discoloration  NEURO: alert & oriented x 3 with fluent speech, no focal motor/sensory deficits  PERFORMANCE STATUS: ECOG 1  LABORATORY DATA: Lab Results  Component Value Date   WBC 10.5* 05/17/2015   HGB 14.7 05/17/2015   HCT 43.1 05/17/2015   MCV 92.3 05/17/2015   PLT 199 05/17/2015      Chemistry      Component Value Date/Time   NA 139 05/17/2015 1347   NA 132* 04/18/2015 1530   K 4.2 05/17/2015 1347   K 3.9 04/18/2015 1530   CL 92* 04/18/2015 1530   CO2 30* 05/17/2015 1347   CO2 29 04/18/2015 1530   BUN 21.1 05/17/2015 1347   BUN 17 04/18/2015 1530   CREATININE 0.7 05/17/2015 1347   CREATININE 0.85 04/18/2015 1530      Component Value Date/Time   CALCIUM 10.0 05/17/2015 1347   CALCIUM 9.4 04/18/2015 1530   ALKPHOS 73 05/17/2015 1347   ALKPHOS 64 04/18/2015 1530   AST 27 05/17/2015 1347   AST 31 04/18/2015 1530   ALT 28 05/17/2015 1347  ALT 28 04/18/2015 1530   BILITOT 0.65 05/17/2015 1347   BILITOT 0.9 04/18/2015 1530       RADIOGRAPHIC STUDIES: Nm Sentinel Node Inj-no Rpt (melanoma)  04/21/2015  CLINICAL DATA: melanoma right shoulder Sulfur colloid was injected by the nuclear medicine technologist for melanoma sentinel node.    ASSESSMENT: This is a very pleasant 70 years old white female recently diagnosed with a stage IB (T2a, N0, M0) superficial malignant melanoma of the right shoulder status post wide excision and sentinel lymph node biopsies on 04/21/2015. The patient is doing fine today except for the persistent right-sided chest pain.  PLAN: I had a lengthy discussion with the patient and her husband today about her current disease stage, prognosis and treatment options. I explained to the patient that there is no benefit for adjuvant therapy for early stage  melanoma and the current standard of care is observation and close monitoring. The patient will need routine skin exam at least every 6 months in the first 5 years. I will also continue to monitor her closely with repeat CBC, comprehensive metabolic panel and LDH every 6 months for the first 2 years then annually after that. For the persistent right-sided chest pain, I will order CT scan of the chest to rule out any metastatic disease to the chest as well as any other abnormality causing her chest pain. The patient agreed to the current plan. She will come back for follow-up visit in 6 months for reevaluation with repeat blood work. She was also advised of using protective sunscreen and to decrease the time of sun exposure. The patient was advised to call immediately if she has any concerning symptoms in the interval.  The patient voices understanding of current disease status and treatment options and is in agreement with the current care plan.  All questions were answered. The patient knows to call the clinic with any problems, questions or concerns. We can certainly see the patient much sooner if necessary.  Thank you so much for allowing me to participate in the care of [name]. I will continue to follow up the patient with you and assist in her care.  I spent 40 minutes counseling the patient face to face. The total time spent in the appointment was 60 minutes.  Disclaimer: This note was dictated with voice recognition software. Similar sounding words can inadvertently be transcribed and may not be corrected upon review.   Levern Pitter K. May 17, 2015, 2:57 PM

## 2015-05-17 NOTE — Telephone Encounter (Signed)
Gave and printed appt sched and avs for pt for Sept °

## 2015-05-18 DIAGNOSIS — C439 Malignant melanoma of skin, unspecified: Secondary | ICD-10-CM | POA: Diagnosis not present

## 2015-05-18 DIAGNOSIS — F321 Major depressive disorder, single episode, moderate: Secondary | ICD-10-CM | POA: Diagnosis not present

## 2015-05-18 DIAGNOSIS — I4460 Unspecified fascicular block: Secondary | ICD-10-CM | POA: Diagnosis not present

## 2015-05-18 DIAGNOSIS — L409 Psoriasis, unspecified: Secondary | ICD-10-CM | POA: Diagnosis not present

## 2015-05-18 DIAGNOSIS — Z1389 Encounter for screening for other disorder: Secondary | ICD-10-CM | POA: Diagnosis not present

## 2015-05-18 DIAGNOSIS — E7800 Pure hypercholesterolemia, unspecified: Secondary | ICD-10-CM | POA: Diagnosis not present

## 2015-05-18 DIAGNOSIS — M859 Disorder of bone density and structure, unspecified: Secondary | ICD-10-CM | POA: Diagnosis not present

## 2015-05-18 DIAGNOSIS — Z0000 Encounter for general adult medical examination without abnormal findings: Secondary | ICD-10-CM | POA: Diagnosis not present

## 2015-05-18 DIAGNOSIS — E119 Type 2 diabetes mellitus without complications: Secondary | ICD-10-CM | POA: Diagnosis not present

## 2015-05-18 DIAGNOSIS — Z6830 Body mass index (BMI) 30.0-30.9, adult: Secondary | ICD-10-CM | POA: Diagnosis not present

## 2015-05-18 DIAGNOSIS — L719 Rosacea, unspecified: Secondary | ICD-10-CM | POA: Diagnosis not present

## 2015-05-20 ENCOUNTER — Ambulatory Visit
Admission: RE | Admit: 2015-05-20 | Discharge: 2015-05-20 | Disposition: A | Discharge status: Home / Self Care | Payer: Medicare Other | Source: Ambulatory Visit | Attending: Internal Medicine | Admitting: Internal Medicine

## 2015-05-20 DIAGNOSIS — C4361 Malignant melanoma of right upper limb, including shoulder: Secondary | ICD-10-CM | POA: Diagnosis not present

## 2015-05-20 DIAGNOSIS — R079 Chest pain, unspecified: Secondary | ICD-10-CM | POA: Diagnosis not present

## 2015-05-20 MED ORDER — IOHEXOL 350 MG/ML SOLN
75.0000 mL | Freq: Once | INTRAVENOUS | Status: AC | PRN
  Administered 2015-05-20: 75 mL via INTRAVENOUS

## 2015-05-23 ENCOUNTER — Ambulatory Visit (HOSPITAL_COMMUNITY): Payer: Medicare Other

## 2015-06-14 ENCOUNTER — Other Ambulatory Visit: Payer: Self-pay | Admitting: Surgery

## 2015-06-14 DIAGNOSIS — R109 Unspecified abdominal pain: Secondary | ICD-10-CM

## 2015-06-15 ENCOUNTER — Ambulatory Visit: Payer: Self-pay | Admitting: Surgery

## 2015-06-15 ENCOUNTER — Ambulatory Visit
Admission: RE | Admit: 2015-06-15 | Discharge: 2015-06-15 | Disposition: A | Discharge status: Home / Self Care | Payer: Medicare Other | Source: Ambulatory Visit | Attending: Surgery | Admitting: Surgery

## 2015-06-15 DIAGNOSIS — K8020 Calculus of gallbladder without cholecystitis without obstruction: Secondary | ICD-10-CM | POA: Diagnosis not present

## 2015-06-15 DIAGNOSIS — R109 Unspecified abdominal pain: Secondary | ICD-10-CM

## 2015-06-28 ENCOUNTER — Other Ambulatory Visit: Payer: Medicare Other

## 2015-07-07 ENCOUNTER — Encounter (HOSPITAL_BASED_OUTPATIENT_CLINIC_OR_DEPARTMENT_OTHER): Payer: Self-pay | Admitting: *Deleted

## 2015-07-11 ENCOUNTER — Encounter (HOSPITAL_BASED_OUTPATIENT_CLINIC_OR_DEPARTMENT_OTHER)
Admission: RE | Admit: 2015-07-11 | Discharge: 2015-07-11 | Disposition: A | Discharge status: Home / Self Care | Payer: Medicare Other | Source: Ambulatory Visit | Attending: Surgery | Admitting: Surgery

## 2015-07-11 DIAGNOSIS — E039 Hypothyroidism, unspecified: Secondary | ICD-10-CM | POA: Diagnosis not present

## 2015-07-11 DIAGNOSIS — Z85820 Personal history of malignant melanoma of skin: Secondary | ICD-10-CM | POA: Diagnosis not present

## 2015-07-11 DIAGNOSIS — K8020 Calculus of gallbladder without cholecystitis without obstruction: Secondary | ICD-10-CM | POA: Diagnosis present

## 2015-07-11 DIAGNOSIS — E119 Type 2 diabetes mellitus without complications: Secondary | ICD-10-CM | POA: Diagnosis not present

## 2015-07-11 DIAGNOSIS — I10 Essential (primary) hypertension: Secondary | ICD-10-CM | POA: Diagnosis not present

## 2015-07-11 DIAGNOSIS — Z87891 Personal history of nicotine dependence: Secondary | ICD-10-CM | POA: Diagnosis not present

## 2015-07-11 DIAGNOSIS — K8010 Calculus of gallbladder with chronic cholecystitis without obstruction: Secondary | ICD-10-CM | POA: Diagnosis not present

## 2015-07-11 LAB — CBC WITH DIFFERENTIAL/PLATELET
BASOS ABS: 0 10*3/uL (ref 0.0–0.1)
Basophils Relative: 0 %
EOS ABS: 0.2 10*3/uL (ref 0.0–0.7)
EOS PCT: 2 %
HCT: 44.2 % (ref 36.0–46.0)
Hemoglobin: 15 g/dL (ref 12.0–15.0)
LYMPHS PCT: 32 %
Lymphs Abs: 3.2 10*3/uL (ref 0.7–4.0)
MCH: 31.3 pg (ref 26.0–34.0)
MCHC: 33.9 g/dL (ref 30.0–36.0)
MCV: 92.1 fL (ref 78.0–100.0)
MONO ABS: 0.8 10*3/uL (ref 0.1–1.0)
Monocytes Relative: 8 %
Neutro Abs: 5.9 10*3/uL (ref 1.7–7.7)
Neutrophils Relative %: 58 %
PLATELETS: 212 10*3/uL (ref 150–400)
RBC: 4.8 MIL/uL (ref 3.87–5.11)
RDW: 13.5 % (ref 11.5–15.5)
WBC: 10 10*3/uL (ref 4.0–10.5)

## 2015-07-11 LAB — COMPREHENSIVE METABOLIC PANEL
ALBUMIN: 3.6 g/dL (ref 3.5–5.0)
ALK PHOS: 68 U/L (ref 38–126)
ALT: 23 U/L (ref 14–54)
ANION GAP: 10 (ref 5–15)
AST: 26 U/L (ref 15–41)
BILIRUBIN TOTAL: 0.9 mg/dL (ref 0.3–1.2)
BUN: 20 mg/dL (ref 6–20)
CALCIUM: 9.5 mg/dL (ref 8.9–10.3)
CO2: 30 mmol/L (ref 22–32)
Chloride: 102 mmol/L (ref 101–111)
Creatinine, Ser: 0.7 mg/dL (ref 0.44–1.00)
GFR calc non Af Amer: >60 mL/min (ref >60)
GLUCOSE: 66 mg/dL (ref 65–99)
Potassium: 3.8 mmol/L (ref 3.5–5.1)
Sodium: 142 mmol/L (ref 135–145)
TOTAL PROTEIN: 7 g/dL (ref 6.5–8.1)

## 2015-07-14 ENCOUNTER — Ambulatory Visit (HOSPITAL_BASED_OUTPATIENT_CLINIC_OR_DEPARTMENT_OTHER): Payer: Medicare Other | Admitting: Anesthesiology

## 2015-07-14 ENCOUNTER — Ambulatory Visit (HOSPITAL_BASED_OUTPATIENT_CLINIC_OR_DEPARTMENT_OTHER)
Admission: RE | Admit: 2015-07-14 | Discharge: 2015-07-14 | Disposition: A | Discharge status: Home / Self Care | Payer: Medicare Other | Source: Ambulatory Visit | Attending: Surgery | Admitting: Surgery

## 2015-07-14 ENCOUNTER — Encounter (HOSPITAL_BASED_OUTPATIENT_CLINIC_OR_DEPARTMENT_OTHER)
Admission: RE | Disposition: A | Discharge status: Home / Self Care | Payer: Self-pay | Source: Ambulatory Visit | Attending: Surgery

## 2015-07-14 ENCOUNTER — Ambulatory Visit (HOSPITAL_COMMUNITY): Payer: Medicare Other

## 2015-07-14 ENCOUNTER — Encounter (HOSPITAL_BASED_OUTPATIENT_CLINIC_OR_DEPARTMENT_OTHER): Payer: Self-pay | Admitting: *Deleted

## 2015-07-14 DIAGNOSIS — E119 Type 2 diabetes mellitus without complications: Secondary | ICD-10-CM | POA: Diagnosis not present

## 2015-07-14 DIAGNOSIS — I10 Essential (primary) hypertension: Secondary | ICD-10-CM | POA: Diagnosis not present

## 2015-07-14 DIAGNOSIS — K8010 Calculus of gallbladder with chronic cholecystitis without obstruction: Secondary | ICD-10-CM | POA: Insufficient documentation

## 2015-07-14 DIAGNOSIS — Z87891 Personal history of nicotine dependence: Secondary | ICD-10-CM | POA: Diagnosis not present

## 2015-07-14 DIAGNOSIS — E039 Hypothyroidism, unspecified: Secondary | ICD-10-CM | POA: Insufficient documentation

## 2015-07-14 DIAGNOSIS — Z85820 Personal history of malignant melanoma of skin: Secondary | ICD-10-CM | POA: Insufficient documentation

## 2015-07-14 DIAGNOSIS — K8020 Calculus of gallbladder without cholecystitis without obstruction: Secondary | ICD-10-CM | POA: Diagnosis not present

## 2015-07-14 DIAGNOSIS — Z8719 Personal history of other diseases of the digestive system: Secondary | ICD-10-CM

## 2015-07-14 HISTORY — DX: Type 2 diabetes mellitus without complications: E11.9

## 2015-07-14 HISTORY — PX: CHOLECYSTECTOMY: SHX55

## 2015-07-14 LAB — GLUCOSE, CAPILLARY: Glucose-Capillary: 73 mg/dL (ref 65–99)

## 2015-07-14 SURGERY — LAPAROSCOPIC CHOLECYSTECTOMY WITH INTRAOPERATIVE CHOLANGIOGRAM
Anesthesia: General | Site: Abdomen

## 2015-07-14 MED ORDER — FENTANYL CITRATE (PF) 100 MCG/2ML IJ SOLN
INTRAMUSCULAR | Status: AC
  Filled 2015-07-14: qty 2

## 2015-07-14 MED ORDER — PROPOFOL 10 MG/ML IV BOLUS
INTRAVENOUS | Status: AC
  Filled 2015-07-14: qty 20

## 2015-07-14 MED ORDER — LACTATED RINGERS IV SOLN
INTRAVENOUS | Status: DC
  Administered 2015-07-14 (×3): via INTRAVENOUS

## 2015-07-14 MED ORDER — SODIUM CHLORIDE 0.9 % IV SOLN
INTRAVENOUS | Status: DC | PRN
  Administered 2015-07-14: 5 mL

## 2015-07-14 MED ORDER — TRAMADOL HCL 50 MG PO TABS: 50.0000 mg | ORAL_TABLET | Freq: Four times a day (QID) | ORAL | Status: DC | PRN

## 2015-07-14 MED ORDER — ONDANSETRON HCL 4 MG/2ML IJ SOLN
INTRAMUSCULAR | Status: DC | PRN
  Administered 2015-07-14: 4 mg via INTRAVENOUS

## 2015-07-14 MED ORDER — DEXAMETHASONE SODIUM PHOSPHATE 4 MG/ML IJ SOLN
INTRAMUSCULAR | Status: DC | PRN
  Administered 2015-07-14: 10 mg via INTRAVENOUS

## 2015-07-14 MED ORDER — PROPOFOL 10 MG/ML IV BOLUS
INTRAVENOUS | Status: DC | PRN
  Administered 2015-07-14: 150 mg via INTRAVENOUS
  Administered 2015-07-14: 50 mg via INTRAVENOUS

## 2015-07-14 MED ORDER — ROCURONIUM BROMIDE 50 MG/5ML IV SOLN
INTRAVENOUS | Status: AC
  Filled 2015-07-14: qty 1

## 2015-07-14 MED ORDER — BUPIVACAINE-EPINEPHRINE 0.25% -1:200000 IJ SOLN
INTRAMUSCULAR | Status: DC | PRN
  Administered 2015-07-14: 10 mL

## 2015-07-14 MED ORDER — MIDAZOLAM HCL 2 MG/2ML IJ SOLN
INTRAMUSCULAR | Status: AC
  Filled 2015-07-14: qty 2

## 2015-07-14 MED ORDER — ONDANSETRON 4 MG PO TBDP: 4.0000 mg | ORAL_TABLET | Freq: Three times a day (TID) | ORAL | Status: DC | PRN

## 2015-07-14 MED ORDER — LACTATED RINGERS IV SOLN: INTRAVENOUS | Status: DC

## 2015-07-14 MED ORDER — IOPAMIDOL (ISOVUE-300) INJECTION 61%
INTRAVENOUS | Status: AC
  Filled 2015-07-14: qty 50

## 2015-07-14 MED ORDER — HEMOSTATIC AGENTS (NO CHARGE) OPTIME
TOPICAL | Status: DC | PRN
  Administered 2015-07-14: 1 via TOPICAL

## 2015-07-14 MED ORDER — SCOPOLAMINE 1 MG/3DAYS TD PT72: 1.0000 | MEDICATED_PATCH | TRANSDERMAL | Status: DC

## 2015-07-14 MED ORDER — OXYCODONE-ACETAMINOPHEN 5-325 MG PO TABS: 1.0000 | ORAL_TABLET | ORAL | Status: DC | PRN

## 2015-07-14 MED ORDER — OXYCODONE HCL 5 MG PO TABS
ORAL_TABLET | ORAL | Status: AC
  Filled 2015-07-14: qty 1

## 2015-07-14 MED ORDER — CIPROFLOXACIN IN D5W 400 MG/200ML IV SOLN
400.0000 mg | INTRAVENOUS | Status: AC
  Administered 2015-07-14: 400 mg via INTRAVENOUS

## 2015-07-14 MED ORDER — FENTANYL CITRATE (PF) 100 MCG/2ML IJ SOLN
50.0000 ug | INTRAMUSCULAR | Status: AC | PRN
Start: 2015-07-14 — End: 2015-07-14
  Administered 2015-07-14 (×4): 50 ug via INTRAVENOUS
  Administered 2015-07-14: 100 ug via INTRAVENOUS

## 2015-07-14 MED ORDER — SODIUM CHLORIDE 0.9 % IR SOLN
Status: DC | PRN
  Administered 2015-07-14: 1000 mL

## 2015-07-14 MED ORDER — CHLORHEXIDINE GLUCONATE 4 % EX LIQD: 1.0000 "application " | Freq: Once | CUTANEOUS | Status: DC

## 2015-07-14 MED ORDER — EPHEDRINE SULFATE 50 MG/ML IJ SOLN
INTRAMUSCULAR | Status: DC | PRN
  Administered 2015-07-14: 10 mg via INTRAVENOUS

## 2015-07-14 MED ORDER — CIPROFLOXACIN IN D5W 400 MG/200ML IV SOLN
INTRAVENOUS | Status: AC
  Filled 2015-07-14: qty 200

## 2015-07-14 MED ORDER — FENTANYL CITRATE (PF) 100 MCG/2ML IJ SOLN
25.0000 ug | INTRAMUSCULAR | Status: DC | PRN
  Administered 2015-07-14: 50 ug via INTRAVENOUS
  Administered 2015-07-14 (×2): 25 ug via INTRAVENOUS

## 2015-07-14 MED ORDER — GLYCOPYRROLATE 0.2 MG/ML IJ SOLN: 0.2000 mg | Freq: Once | INTRAMUSCULAR | Status: DC | PRN

## 2015-07-14 MED ORDER — SCOPOLAMINE 1 MG/3DAYS TD PT72: 1.0000 | MEDICATED_PATCH | Freq: Once | TRANSDERMAL | Status: DC | PRN

## 2015-07-14 MED ORDER — MIDAZOLAM HCL 2 MG/2ML IJ SOLN
1.0000 mg | INTRAMUSCULAR | Status: DC | PRN
  Administered 2015-07-14: 2 mg via INTRAVENOUS

## 2015-07-14 MED ORDER — SUGAMMADEX SODIUM 200 MG/2ML IV SOLN
INTRAVENOUS | Status: DC | PRN
  Administered 2015-07-14: 200 mg via INTRAVENOUS

## 2015-07-14 MED ORDER — OXYCODONE HCL 5 MG PO TABS
5.0000 mg | ORAL_TABLET | Freq: Once | ORAL | Status: AC
  Administered 2015-07-14: 5 mg via ORAL

## 2015-07-14 MED ORDER — LIDOCAINE 2% (20 MG/ML) 5 ML SYRINGE
INTRAMUSCULAR | Status: AC
  Filled 2015-07-14: qty 5

## 2015-07-14 MED ORDER — ROCURONIUM BROMIDE 100 MG/10ML IV SOLN
INTRAVENOUS | Status: DC | PRN
  Administered 2015-07-14: 35 mg via INTRAVENOUS

## 2015-07-14 MED ORDER — LIDOCAINE HCL (CARDIAC) 20 MG/ML IV SOLN
INTRAVENOUS | Status: DC | PRN
  Administered 2015-07-14: 60 mg via INTRAVENOUS

## 2015-07-14 SURGICAL SUPPLY — 43 items
APPLIER CLIP ROT 10 11.4 M/L (STAPLE) ×3
APR CLP MED LRG 11.4X10 (STAPLE) ×1
BAG SPEC RTRVL LRG 6X4 10 (ENDOMECHANICALS) ×1
BLADE CLIPPER SURG (BLADE) IMPLANT
CANISTER SUCT 1200ML W/VALVE (MISCELLANEOUS) ×3 IMPLANT
CHLORAPREP W/TINT 26ML (MISCELLANEOUS) ×3 IMPLANT
CLIP APPLIE ROT 10 11.4 M/L (STAPLE) ×1 IMPLANT
CLOSURE WOUND 1/2 X4 (GAUZE/BANDAGES/DRESSINGS)
COVER MAYO STAND STRL (DRAPES) ×3 IMPLANT
DECANTER SPIKE VIAL GLASS SM (MISCELLANEOUS) IMPLANT
DRAPE C-ARM 42X72 X-RAY (DRAPES) ×3 IMPLANT
DRAPE LAPAROSCOPIC ABDOMINAL (DRAPES) ×3 IMPLANT
ELECT REM PT RETURN 9FT ADLT (ELECTROSURGICAL)
ELECTRODE REM PT RTRN 9FT ADLT (ELECTROSURGICAL) ×1 IMPLANT
FILTER SMOKE EVAC LAPAROSHD (FILTER) IMPLANT
GLOVE BIO SURGEON STRL SZ8 (GLOVE) ×3 IMPLANT
GLOVE BIOGEL PI IND STRL 8 (GLOVE) ×1 IMPLANT
GLOVE BIOGEL PI INDICATOR 8 (GLOVE) ×4
GLOVE SURG SS PI 7.5 STRL IVOR (GLOVE) ×2 IMPLANT
GOWN STRL REUS W/ TWL LRG LVL3 (GOWN DISPOSABLE) ×3 IMPLANT
GOWN STRL REUS W/TWL LRG LVL3 (GOWN DISPOSABLE) ×6
HEMOSTAT SNOW SURGICEL 2X4 (HEMOSTASIS) ×4 IMPLANT
LINER CANISTER 1000CC FLEX (MISCELLANEOUS) ×3 IMPLANT
LIQUID BAND (GAUZE/BANDAGES/DRESSINGS) ×3 IMPLANT
NS IRRIG 1000ML POUR BTL (IV SOLUTION) IMPLANT
PACK BASIN DAY SURGERY FS (CUSTOM PROCEDURE TRAY) ×3 IMPLANT
POUCH SPECIMEN RETRIEVAL 10MM (ENDOMECHANICALS) ×3 IMPLANT
SCISSORS LAP 5X35 DISP (ENDOMECHANICALS) ×3 IMPLANT
SET CHOLANGIOGRAPH 5 50 .035 (SET/KITS/TRAYS/PACK) ×3 IMPLANT
SET IRRIG TUBING LAPAROSCOPIC (IRRIGATION / IRRIGATOR) ×3 IMPLANT
SLEEVE ENDOPATH XCEL 5M (ENDOMECHANICALS) ×3 IMPLANT
SLEEVE SCD COMPRESS KNEE MED (MISCELLANEOUS) ×3 IMPLANT
STRIP CLOSURE SKIN 1/2X4 (GAUZE/BANDAGES/DRESSINGS) IMPLANT
SUT MNCRL AB 4-0 PS2 18 (SUTURE) ×3 IMPLANT
SUT VICRYL 0 UR6 27IN ABS (SUTURE) IMPLANT
TOWEL OR 17X24 6PK STRL BLUE (TOWEL DISPOSABLE) ×3 IMPLANT
TRAY LAPAROSCOPIC (CUSTOM PROCEDURE TRAY) ×3 IMPLANT
TROCAR XCEL BLUNT TIP 100MML (ENDOMECHANICALS) ×3 IMPLANT
TROCAR XCEL NON-BLD 11X100MML (ENDOMECHANICALS) ×3 IMPLANT
TROCAR XCEL NON-BLD 5MMX100MML (ENDOMECHANICALS) ×3 IMPLANT
TUBE CONNECTING 20'X1/4 (TUBING) ×1
TUBE CONNECTING 20X1/4 (TUBING) ×2 IMPLANT
TUBING INSUFFLATION (TUBING) ×5 IMPLANT

## 2015-07-14 NOTE — Addendum Note (Signed)
Addendum  created 07/14/15 1415 by Maryella Shivers, CRNA   Modules edited: Anesthesia Medication Administration

## 2015-07-14 NOTE — Anesthesia Postprocedure Evaluation (Signed)
Anesthesia Post Note  Patient: [name]  Procedure(s) Performed: Procedure(s) (LRB): LAPAROSCOPIC CHOLECYSTECTOMY WITH INTRAOPERATIVE CHOLANGIOGRAM (N/A)  Patient location during evaluation: PACU Anesthesia Type: General Level of consciousness: awake and alert Pain management: pain level controlled Vital Signs Assessment: post-procedure vital signs reviewed and stable Respiratory status: spontaneous breathing, nonlabored ventilation, respiratory function stable and patient connected to nasal cannula oxygen Cardiovascular status: blood pressure returned to baseline and stable Postop Assessment: no signs of nausea or vomiting Anesthetic complications: no    Last Vitals:  Filed Vitals:   07/14/15 0925  BP: 140/60  Pulse: 50  Temp: 36.4 C  Resp: 18    Last Pain:  Filed Vitals:   07/14/15 1358  PainSc: 8                  Shaunae Sieloff L

## 2015-07-14 NOTE — Anesthesia Procedure Notes (Addendum)
Date/Time: 07/14/2015 12:03 PM Performed by: Golden Hurter C   Procedure Name: Intubation Date/Time: 07/14/2015 12:03 PM Performed by: Maryella Shivers Pre-anesthesia Checklist: Patient identified, Emergency Drugs available, Suction available and Patient being monitored Patient Re-evaluated:Patient Re-evaluated prior to inductionOxygen Delivery Method: Circle System Utilized Preoxygenation: Pre-oxygenation with 100% oxygen Intubation Type: IV induction Ventilation: Mask ventilation without difficulty Laryngoscope Size: Mac and 3 Grade View: Grade I Tube type: Oral Tube size: 7.0 mm Number of attempts: 1 Airway Equipment and Method: Stylet and Oral airway Placement Confirmation: ETT inserted through vocal cords under direct vision,  positive ETCO2 and breath sounds checked- equal and bilateral Secured at: 19 cm Tube secured with: Tape Dental Injury: Teeth and Oropharynx as per pre-operative assessment

## 2015-07-14 NOTE — Anesthesia Preprocedure Evaluation (Signed)
Anesthesia Evaluation  Patient identified by MRN, date of birth, ID band Patient awake    Reviewed: Allergy & Precautions, H&P , NPO status , Patient's Chart, lab work & pertinent test results  History of Anesthesia Complications (+) PONV  Airway Mallampati: II  TM Distance: >3 FB Neck ROM: full    Dental no notable dental hx. (+) Dental Advisory Given, Teeth Intact   Pulmonary neg pulmonary ROS, former smoker,    Pulmonary exam normal breath sounds clear to auscultation       Cardiovascular Exercise Tolerance: Good hypertension, Pt. on medications Normal cardiovascular exam Rhythm:regular Rate:Normal     Neuro/Psych negative neurological ROS  negative psych ROS   GI/Hepatic negative GI ROS, Neg liver ROS,   Endo/Other  diabetes, Well Controlled, Type 2Hypothyroidism Diet controlled DM  Renal/GU negative Renal ROS  negative genitourinary   Musculoskeletal   Abdominal   Peds  Hematology negative hematology ROS (+)   Anesthesia Other Findings   Reproductive/Obstetrics negative OB ROS                             Anesthesia Physical Anesthesia Plan  ASA: III  Anesthesia Plan: General   Post-op Pain Management:    Induction: Intravenous  Airway Management Planned: Oral ETT  Additional Equipment:   Intra-op Plan:   Post-operative Plan: Extubation in OR  Informed Consent: I have reviewed the patients History and Physical, chart, labs and discussed the procedure including the risks, benefits and alternatives for the proposed anesthesia with the patient or authorized representative who has indicated his/her understanding and acceptance.   Dental Advisory Given  Plan Discussed with: CRNA and Surgeon  Anesthesia Plan Comments:         Anesthesia Quick Evaluation

## 2015-07-14 NOTE — Discharge Instructions (Signed)
[facility] ______[facility] °LAPAROSCOPIC SURGERY: POST OP INSTRUCTIONS °Always review your discharge instruction sheet given to you by the facility where your surgery was performed. °IF YOU HAVE DISABILITY OR FAMILY LEAVE FORMS, YOU MUST BRING THEM TO THE OFFICE FOR PROCESSING.   °DO NOT GIVE THEM TO YOUR DOCTOR. ° °1. A prescription for pain medication may be given to you upon discharge.  Take your pain medication as prescribed, if needed.  If narcotic pain medicine is not needed, then you may take acetaminophen (Tylenol) or ibuprofen (Advil) as needed. °2. Take your usually prescribed medications unless otherwise directed. °3. If you need a refill on your pain medication, please contact your pharmacy.  They will contact our office to request authorization. Prescriptions will not be filled after 5pm or on week-ends. °4. You should follow a light diet the first few days after arrival home, such as soup and crackers, etc.  Be sure to include lots of fluids daily. °5. Most patients will experience some swelling and bruising in the area of the incisions.  Ice packs will help.  Swelling and bruising can take several days to resolve.  °6. It is common to experience some constipation if taking pain medication after surgery.  Increasing fluid intake and taking a stool softener (such as Colace) will usually help or prevent this problem from occurring.  A mild laxative (Milk of Magnesia or Miralax) should be taken according to package instructions if there are no bowel movements after 48 hours. °7. Unless discharge instructions indicate otherwise, you may remove your bandages 24-48 hours after surgery, and you may shower at that time.  You may have steri-strips (small skin tapes) in place directly over the incision.  These strips should be left on the skin for 7-10 days.  If your surgeon used skin glue on the incision, you may shower in 24 hours.  The glue will flake off over the next 2-3 weeks.  Any sutures or  staples will be removed at the office during your follow-up visit. °8. ACTIVITIES:  You may resume regular (light) daily activities beginning the next day--such as daily self-care, walking, climbing stairs--gradually increasing activities as tolerated.  You may have sexual intercourse when it is comfortable.  Refrain from any heavy lifting or straining until approved by your doctor. °a. You may drive when you are no longer taking prescription pain medication, you can comfortably wear a seatbelt, and you can safely maneuver your car and apply brakes. °b. RETURN TO WORK:  __________________________________________________________ °9. You should see your doctor in the office for a follow-up appointment approximately 2-3 weeks after your surgery.  Make sure that you call for this appointment within a day or two after you arrive home to insure a convenient appointment time. °10. OTHER INSTRUCTIONS: __________________________________________________________________________________________________________________________ __________________________________________________________________________________________________________________________ °WHEN TO CALL YOUR DOCTOR: °1. Fever over 101.0 °2. Inability to urinate °3. Continued bleeding from incision. °4. Increased pain, redness, or drainage from the incision. °5. Increasing abdominal pain ° °The clinic staff is available to answer your questions during regular business hours.  Please don’t hesitate to call and ask to speak to one of the nurses for clinical concerns.  If you have a medical emergency, go to the nearest emergency room or call 911.  A surgeon from [facility] is always on call at the hospital. °[address], [address], [place], [place]  [address] ? [address] [address], , [place]   [address] °[phone] ? [phone] ? FAX [phone] °Web site:   [email or web address] ° °Post Anesthesia Home Care Instructions ° °Activity: °Get  plenty of rest for the remainder of the day. A responsible adult should stay with you for 24 hours following the procedure.  °For the next 24 hours, DO NOT: °-Drive a car °-Operate machinery °-Drink alcoholic beverages °-Take any medication unless instructed by your physician °-Make any legal decisions or sign important papers. ° °Meals: °Start with liquid foods such as gelatin or soup. Progress to regular foods as tolerated. Avoid greasy, spicy, heavy foods. If nausea and/or vomiting occur, drink only clear liquids until the nausea and/or vomiting subsides. Call your physician if vomiting continues. ° °Special Instructions/Symptoms: °Your throat may feel dry or sore from the anesthesia or the breathing tube placed in your throat during surgery. If this causes discomfort, gargle with warm salt water. The discomfort should disappear within 24 hours. ° °If you had a scopolamine patch placed behind your ear for the management of post- operative nausea and/or vomiting: ° °1. The medication in the patch is effective for 72 hours, after which it should be removed.  Wrap patch in a tissue and discard in the trash. Wash hands thoroughly with soap and water. °2. You may remove the patch earlier than 72 hours if you experience unpleasant side effects which may include dry mouth, dizziness or visual disturbances. °3. Avoid touching the patch. Wash your hands with soap and water after contact with the patch. °  ° °

## 2015-07-14 NOTE — Op Note (Signed)
Laparoscopic Cholecystectomy with IOC Procedure Note  Indications: This patient presents with symptomatic gallbladder disease and will undergo laparoscopic cholecystectomy. The procedure has been discussed with the patient. Operative and non operative treatments have been discussed. Risks of surgery include bleeding, infection,  Common bile duct injury,  Injury to the stomach,liver, colon,small intestine, abdominal wall,  Diaphragm,  Major blood vessels,  And the need for an open procedure.  Other risks include worsening of medical problems, death,  DVT and pulmonary embolism, and cardiovascular events.   Medical options have also been discussed. The patient has been informed of long term expectations of surgery and non surgical options,  The patient agrees to proceed.    Pre-operative Diagnosis: Calculus of gallbladder without mention of cholecystitis or obstruction  Post-operative Diagnosis: Same  Surgeon: Anelis Hrivnak A.   Assistants: none   Anesthesia: General endotracheal anesthesia and Local anesthesia 0.25.% bupivacaine, with epinephrine  ASA Class: 2  Procedure Details  The patient was seen again in the Holding Room. The risks, benefits, complications, treatment options, and expected outcomes were discussed with the patient. The possibilities of reaction to medication, pulmonary aspiration, perforation of viscus, bleeding, recurrent infection, finding a normal gallbladder, the need for additional procedures, failure to diagnose a condition, the possible need to convert to an open procedure, and creating a complication requiring transfusion or operation were discussed with the patient. The patient and/or family concurred with the proposed plan, giving informed consent. The site of surgery properly noted/marked. The patient was taken to Operating Room, identified as [name] and the procedure verified as Laparoscopic Cholecystectomy with Intraoperative Cholangiograms. A Time Out was  held and the above information confirmed.  Prior to the induction of general anesthesia, antibiotic prophylaxis was administered. General endotracheal anesthesia was then administered and tolerated well. After the induction, the abdomen was prepped in the usual sterile fashion. The patient was positioned in the supine position with the left arm comfortably tucked, along with some reverse Trendelenburg.  Local anesthetic agent was injected into the skin near the umbilicus and an incision made. The midline fascia was incised and the Hasson technique was used to introduce a 12 mm port under direct vision. It was secured with a figure of eight Vicryl suture placed in the usual fashion. Pneumoperitoneum was then created with CO2 and tolerated well without any adverse changes in the patient's vital signs. Additional trocars were introduced under direct vision with an 11 mm trocar in the epigastrium and 2 5 mm trocars in the right upper quadrant. All skin incisions were infiltrated with a local anesthetic agent before making the incision and placing the trocars.   The gallbladder was identified, the fundus grasped and retracted cephalad. Adhesions were lysed bluntly and with the electrocautery where indicated, taking care not to injure any adjacent organs or viscus. The infundibulum was grasped and retracted laterally, exposing the peritoneum overlying the triangle of Calot. This was then divided and exposed in a blunt fashion. The cystic duct was clearly identified and bluntly dissected circumferentially. The junctions of the gallbladder, cystic duct and common bile duct were clearly identified prior to the division of any linear structure.   An incision was made in the cystic duct and the cholangiogram catheter introduced. The catheter was secured using an endoclip. The study showed no stones and good visualization of the distal and proximal biliary tree. The catheter was then removed.   The cystic duct was  then  ligated with surgical clips  on the patient side  and  clipped on the gallbladder side and divided. The cystic artery was identified, dissected free, ligated with clips and divided as well. Posterior cystic artery clipped and divided.  The gallbladder was dissected from the liver bed in retrograde fashion with the electrocautery. The gallbladder was removed. The liver bed was irrigated and inspected. Hemostasis was achieved with the electrocautery and Surgicel. Copious irrigation was utilized and was repeatedly aspirated until clear all particulate matter. Hemostasis was achieved with no signs  Of bleeding or bile leakage.  Pneumoperitoneum was completely reduced after viewing removal of the trocars under direct vision. The wound was thoroughly irrigated and the fascia was then closed with a figure of eight suture; the skin was then closed with 4 O monocryl  and a sterile dressing of Liquid adhesive  was applied.  Instrument, sponge, and needle counts were correct at closure and at the conclusion of the case.   Findings:  Cholelithiasis  Estimated Blood Loss: Minimal         Drains: none         Total IV Fluids: 800 mL         Specimens: Gallbladder           Complications: None; patient tolerated the procedure well.         Disposition: PACU - hemodynamically stable.         Condition: stable

## 2015-07-14 NOTE — Interval H&P Note (Signed)
History and Physical Interval Note:  07/14/2015 11:23 AM  [name]  has presented today for surgery, with the diagnosis of Gallstones  The various methods of treatment have been discussed with the patient and family. After consideration of risks, benefits and other options for treatment, the patient has consented to  Procedure(s): LAPAROSCOPIC CHOLECYSTECTOMY WITH INTRAOPERATIVE CHOLANGIOGRAM (N/A) as a surgical intervention .  The patient's history has been reviewed, patient examined, no change in status, stable for surgery.  I have reviewed the patient's chart and labs.  Questions were answered to the patient's satisfaction.     Vyron Fronczak A.

## 2015-07-14 NOTE — Transfer of Care (Signed)
Immediate Anesthesia Transfer of Care Note  Patient: [name]  Procedure(s) Performed: Procedure(s): LAPAROSCOPIC CHOLECYSTECTOMY WITH INTRAOPERATIVE CHOLANGIOGRAM (N/A)  Patient Location: PACU  Anesthesia Type:General  Level of Consciousness: awake, alert  and oriented  Airway & Oxygen Therapy: Patient Spontanous Breathing and Patient connected to face mask oxygen  Post-op Assessment: Report given to RN and Post -op Vital signs reviewed and stable  Post vital signs: Reviewed and stable  Last Vitals:  Filed Vitals:   07/14/15 0925  BP: 140/60  Pulse: 50  Temp: 36.4 C  Resp: 18    Last Pain:  Filed Vitals:   07/14/15 0927  PainSc: 4          Complications: No apparent anesthesia complications

## 2015-07-14 NOTE — H&P (Signed)
[name] is an 70 y.o. female.   Chief Complaint: abdominal pain HPI: pt has a hx of RUQ pain and CT findings of a large gallstone.  Pt has pain with eating.    Past Medical History  Diagnosis Date  . Hypothyroidism   . Psoriasis   . Hypertension   . Environmental allergies   . Allergy     seasonal, pcn  . Cancer ([place]) 2011, 2017    melanoma on back, rt shoulder  . Rosacea   . PONV (postoperative nausea and vomiting)   . Diabetes mellitus without complication [facility])     Past Surgical History  Procedure Laterality Date  . Ectopic pregnancy surgery  1974  . Dilation and curettage of uterus  1974  . Abdominal hysterectomy  1984  . Lumbar disc surgery  1977  . Carpal tunnel release  1986  . Melanoma excision  11-2009  . Back surgery      lumbar  . Tonsillectomy    . Diagnostic laparoscopy    . Appendectomy    . Cesarean section      x2  . Melanoma excision with sentinel lymph node biopsy Right 04/21/2015    Procedure: MELANOMA EXCISION RIGHT SHOULDER WITH SENTINEL LYMPH NODE BIOPSY;  Surgeon: Erroll Luna, MD;  Location: [facility];  Service: General;  Laterality: Right;   Sentinel node mapping right neck    Family History  Problem Relation Age of Onset  . Colon cancer Brother   . Liver disease Brother   . Pancreatic cancer Brother   . Heart disease Mother   . Anemia Mother   . Heart disease Father    Social History:  reports that she has quit smoking. She has never used smokeless tobacco. She reports that she does not drink alcohol or use illicit drugs.  Allergies:  Allergies  Allergen Reactions  . Biaxin [Clarithromycin] Nausea Only  . Penicillins     Unknown reaction  . Sulfamethoxazole Nausea Only  . Soriatane [Acitretin] Rash    No prescriptions prior to admission    No results found for this or any previous visit (from the past 53 hour(s)). No results found.  Review of Systems  Respiratory: Negative.   Cardiovascular: Negative.    Gastrointestinal: Positive for abdominal pain.    Height 5' (1.524 m), weight 68.04 kg (150 lb). Physical Exam  Constitutional: She appears well-developed.  Eyes: Pupils are equal, round, and reactive to light. No scleral icterus.  Cardiovascular: Normal rate.   Respiratory: Effort normal.  GI: Soft. There is no tenderness.  Musculoskeletal: Normal range of motion.  Neurological: She is alert.  Skin:  Previous right shoulder and neck scars from melanoma surgery healing      Assessment/Plan Symptomatic gallstones Recommend cholecystectomy  And possible cholangiogram   The procedure has been discussed with the patient. Operative and non operative treatments have been discussed. Risks of surgery include bleeding, infection,  Common bile duct injury,  Injury to the stomach,liver, colon,small intestine, abdominal wall,  Diaphragm,  Major blood vessels,  And the need for an open procedure.  Other risks include worsening of medical problems, death,  DVT and pulmonary embolism, and cardiovascular events.   Medical options have also been discussed. The patient has been informed of long term expectations of surgery and non surgical options,  The patient agrees to proceed.    Aalijah Lanphere A., MD 07/14/2015, 7:49 AM

## 2015-07-17 ENCOUNTER — Encounter (HOSPITAL_BASED_OUTPATIENT_CLINIC_OR_DEPARTMENT_OTHER): Payer: Self-pay | Admitting: Surgery

## 2015-07-22 ENCOUNTER — Ambulatory Visit: Payer: Medicare Other

## 2015-07-22 ENCOUNTER — Encounter: Payer: Self-pay | Admitting: Emergency Medicine

## 2015-07-22 ENCOUNTER — Ambulatory Visit
Admission: EM | Admit: 2015-07-22 | Discharge: 2015-07-22 | Disposition: A | Discharge status: Home / Self Care | Payer: Medicare Other | Attending: Internal Medicine | Admitting: Internal Medicine

## 2015-07-22 ENCOUNTER — Telehealth: Payer: Self-pay | Admitting: Surgery

## 2015-07-22 DIAGNOSIS — Z87891 Personal history of nicotine dependence: Secondary | ICD-10-CM | POA: Insufficient documentation

## 2015-07-22 DIAGNOSIS — Z882 Allergy status to sulfonamides status: Secondary | ICD-10-CM | POA: Diagnosis not present

## 2015-07-22 DIAGNOSIS — Z85820 Personal history of malignant melanoma of skin: Secondary | ICD-10-CM | POA: Insufficient documentation

## 2015-07-22 DIAGNOSIS — I10 Essential (primary) hypertension: Secondary | ICD-10-CM | POA: Insufficient documentation

## 2015-07-22 DIAGNOSIS — L409 Psoriasis, unspecified: Secondary | ICD-10-CM | POA: Insufficient documentation

## 2015-07-22 DIAGNOSIS — Z7982 Long term (current) use of aspirin: Secondary | ICD-10-CM | POA: Insufficient documentation

## 2015-07-22 DIAGNOSIS — E119 Type 2 diabetes mellitus without complications: Secondary | ICD-10-CM | POA: Diagnosis not present

## 2015-07-22 DIAGNOSIS — Z880 Allergy status to penicillin: Secondary | ICD-10-CM | POA: Diagnosis not present

## 2015-07-22 DIAGNOSIS — R0781 Pleurodynia: Secondary | ICD-10-CM | POA: Diagnosis present

## 2015-07-22 DIAGNOSIS — W19XXXA Unspecified fall, initial encounter: Secondary | ICD-10-CM | POA: Insufficient documentation

## 2015-07-22 DIAGNOSIS — S2231XA Fracture of one rib, right side, initial encounter for closed fracture: Secondary | ICD-10-CM | POA: Diagnosis not present

## 2015-07-22 DIAGNOSIS — Z888 Allergy status to other drugs, medicaments and biological substances status: Secondary | ICD-10-CM | POA: Diagnosis not present

## 2015-07-22 DIAGNOSIS — S2241XA Multiple fractures of ribs, right side, initial encounter for closed fracture: Secondary | ICD-10-CM | POA: Diagnosis not present

## 2015-07-22 DIAGNOSIS — E039 Hypothyroidism, unspecified: Secondary | ICD-10-CM | POA: Diagnosis not present

## 2015-07-22 DIAGNOSIS — Z881 Allergy status to other antibiotic agents status: Secondary | ICD-10-CM | POA: Diagnosis not present

## 2015-07-22 MED ORDER — OXYCODONE-ACETAMINOPHEN 10-325 MG PO TABS
1.0000 | ORAL_TABLET | ORAL | Status: DC | PRN
Start: 2015-07-22 — End: 2016-04-14

## 2015-07-22 NOTE — ED Notes (Signed)
Patient states that she tripped over her hose outside and landed on her right side.  Patient c/o right sided pain below her rib cage.  Patient denies hitting her head and no LOC.

## 2015-07-22 NOTE — Telephone Encounter (Signed)
[name] had a lap chole by Dr. Brantley Stage on 07/14/2015.  She has had an uneventful post op course.  She tripped over a hose today and broke 4 ribs on her right side.  She went to an urgent care near her to get x-rays.  She was worried that this may effect her gall bladder surgery.  I told her that she will do okay from her gall bladder surgery, but she is going to struggle with pain and breathing with the broken ribs.  But it sounds as if there is nothing more to do today.  Also, she lived near [place] and [place] in [place].  We talked about them for a while.  Alphonsa Overall, MD, [facility] Pager: [phone] Office phone:  [phone]

## 2015-07-22 NOTE — ED Provider Notes (Signed)
CSN: [id number]     Arrival date & time 07/22/15  1456 History   First MD Initiated Contact with Patient 07/22/15 1525     Chief Complaint  Patient presents with  . Fall   (Consider location/radiation/quality/duration/timing/severity/associated sxs/prior Treatment) HPI History obtained from patient: Location: Right chest/ribs   Context/Duration: Approximately 11:30 this morning patient was in her garden and she tripped over a hose striking her right side.  Severity: 6  Quality: Aching Timing: Constant           Home Treatment: Cold compress at home Associated symptoms:  None. Patient does state that she had cholecystectomy done last week and she has been sore from that also. Family History: Cancer with sibling and parent Social History: Ex-smoker, no alcohol use  Past Medical History  Diagnosis Date  . Hypothyroidism   . Psoriasis   . Hypertension   . Environmental allergies   . Allergy     seasonal, pcn  . Cancer ([place]) 2011, 2017    melanoma on back, rt shoulder  . Rosacea   . PONV (postoperative nausea and vomiting)   . Diabetes mellitus without complication [facility])    Past Surgical History  Procedure Laterality Date  . Ectopic pregnancy surgery  1974  . Dilation and curettage of uterus  1974  . Abdominal hysterectomy  1984  . Lumbar disc surgery  1977  . Carpal tunnel release  1986  . Melanoma excision  11-2009  . Back surgery      lumbar  . Tonsillectomy    . Diagnostic laparoscopy    . Appendectomy    . Cesarean section      x2  . Melanoma excision with sentinel lymph node biopsy Right 04/21/2015    Procedure: MELANOMA EXCISION RIGHT SHOULDER WITH SENTINEL LYMPH NODE BIOPSY;  Surgeon: Erroll Luna, MD;  Location: [facility];  Service: General;  Laterality: Right;   Sentinel node mapping right neck  . Cholecystectomy N/A 07/14/2015    Procedure: LAPAROSCOPIC CHOLECYSTECTOMY WITH INTRAOPERATIVE CHOLANGIOGRAM;  Surgeon: Erroll Luna, MD;  Location:  [place];  Service: General;  Laterality: N/A;   Family History  Problem Relation Age of Onset  . Colon cancer Brother   . Liver disease Brother   . Pancreatic cancer Brother   . Heart disease Mother   . Anemia Mother   . Heart disease Father    Social History  Substance Use Topics  . Smoking status: Former Research scientist (life sciences)  . Smokeless tobacco: Never Used  . Alcohol Use: No   OB History    No data available     Review of Systems ROS +'veright rib injury  Denies: HEADACHE, NAUSEA, ABDOMINAL PAIN, CHEST PAIN, CONGESTION, DYSURIA, SHORTNESS OF BREATH  Allergies  Biaxin; Penicillins; Sulfamethoxazole; and Soriatane  Home Medications   Prior to Admission medications   Medication Sig Start Date End Date Taking? Authorizing Provider  aspirin EC 81 MG tablet Take 81 mg by mouth daily.    Historical Provider, MD  calcipotriene-betamethasone (TACLONEX) ointment Apply topically 2 (two) times daily. For 6 weeks    Historical Provider, MD  Calcium Carbonate-Vitamin D (CALTRATE 600+D PO) Take by mouth.    Historical Provider, MD  FINACEA 15 % cream  03/10/15   Historical Provider, MD  levothyroxine (SYNTHROID, LEVOTHROID) 100 MCG tablet Take 100 mcg by mouth daily before breakfast.    Historical Provider, MD  lisinopril-hydrochlorothiazide (PRINZIDE,ZESTORETIC) 10-12.5 MG per tablet Take 1 tablet by mouth daily.  Historical Provider, MD  montelukast (SINGULAIR) 10 MG tablet Take 10 mg by mouth daily.    Historical Provider, MD  ondansetron (ZOFRAN ODT) 4 MG disintegrating tablet Take 1 tablet (4 mg total) by mouth every 8 (eight) hours as needed for nausea or vomiting. 07/14/15   Erroll Luna, MD  [facility] DELICA LANCETS [id number] MISC TEST SUGAR ONCE D 05/13/15   Historical Provider, MD  Glory Rosebush VERIO test strip TEST ONCE D 03/12/15   Historical Provider, MD  oxyCODONE-acetaminophen (ROXICET) 5-325 MG tablet Take 1-2 tablets by mouth every 4 (four) hours as needed. 07/14/15   Erroll Luna, MD  traMADol (ULTRAM) 50 MG tablet Take 1 tablet (50 mg total) by mouth every 6 (six) hours as needed. 07/14/15   Erroll Luna, MD  triamcinolone cream (KENALOG) 0.1 %  02/23/15   Historical Provider, MD   Meds Ordered and Administered this Visit  Medications - No data to display  BP 133/61 mmHg  Pulse 56  Temp(Src) 97.3 F (36.3 C) (Tympanic)  Resp 16  Ht 5' (1.524 m)  Wt 149 lb (67.586 kg)  BMI 29.10 kg/m2  SpO2 100% No data found.   Physical Exam NURSES NOTES AND VITAL SIGNS REVIEWED. CONSTITUTIONAL: Well developed, well nourished, no acute distress HEENT: normocephalic, atraumatic EYES: Conjunctiva normal NECK:normal ROM, supple, no adenopathy PULMONARY:No respiratory distress, normal effort, tender right lower ribs. No bruise noted.  ABDOMINAL: Soft, ND, NT BS+, No CVAT MUSCULOSKELETAL: Normal ROM of all extremities,  SKIN: warm and dry without rash PSYCHIATRIC: Mood and affect, behavior are normal  ED Course  Procedures (including critical care time)  Labs Review Labs Reviewed - No data to display  Imaging Review No results found. I HAVE PERSONALLY  REVIEWED AND DISCUSSED RESULTS OF  X-RAYS WITH PATIENT AND HER HUSBAND PRIOR TO DISCHARGE.     Visual Acuity Review  Right Eye Distance:   Left Eye Distance:   Bilateral Distance:    Right Eye Near:   Left Eye Near:    Bilateral Near:         MDM   1. Right rib fracture, closed, initial encounter    Total Visit Time:25 minutes "GREATER THAN 50% WAS SPENT IN COUNSELING AND COORDINATION OF CARE WITH THE PATIENT" DISCUSSION OF RIB FRACTURES, TREATMENT OPTIONS, REASONS TO FOLLOW UP, ARRANGEMENT OF FOLLOW UP WITH HER SURGEON AND PCP. RX FOR PERCOCET.  WE ALSO DISCUSSED IF PATIENT NEEDS TO BE IN HOSPITAL. PATIENT STATES SHE PREFERS TO BE HOME, HAS HER HUSBAND TO HELP, HAS RECLINER TO SLEEP IN, AND WILL GO TO ER IF THERE ARE NEW OR WORSENING OF SYMPTOMS.    Patient is reassured that there are no  issues that require transfer to higher level of care at this time or additional tests. Patient is advised to continue home symptomatic treatment. Patient is advised that if there are new or worsening symptoms to attend the emergency department, contact primary care provider, or return to UC. Instructions of care provided discharged home in stable condition.    THIS NOTE WAS GENERATED USING A VOICE RECOGNITION SOFTWARE PROGRAM. ALL REASONABLE EFFORTS  WERE MADE TO PROOFREAD THIS DOCUMENT FOR ACCURACY.  I have verbally reviewed the discharge instructions with the patient. A printed AVS was given to the patient.  All questions were answered prior to discharge.      Konrad Felix, [place] 07/22/15 1651

## 2015-07-22 NOTE — Discharge Instructions (Signed)
Rib Fracture °A rib fracture is a break or crack in one of the bones of the ribs. The ribs are like a cage that goes around your upper chest. A broken or cracked rib is often painful, but most do not cause other problems. Most rib fractures heal on their own in 1-3 months. °HOME CARE °· Avoid activities that cause pain to the injured area. Protect your injured area. °· Slowly increase activity as told by your doctor. °· Take medicine as told by your doctor. °· Put ice on the injured area for the first 1-2 days after you have been treated or as told by your doctor. °¨ Put ice in a plastic bag. °¨ Place a towel between your skin and the bag. °¨ Leave the ice on for 15-20 minutes at a time, every 2 hours while you are awake. °· Do deep breathing as told by your doctor. You may be told to: °¨ Take deep breaths many times a day. °¨ Cough many times a day while hugging a pillow. °¨ Use a device (incentive spirometer) to perform deep breathing many times a day. °· Drink enough fluids to keep your pee (urine) clear or pale yellow.   °· Do not wear a rib belt or binder. These do not allow you to breathe deeply. °GET HELP RIGHT AWAY IF:  °· You have a fever. °· You have trouble breathing.   °· You cannot stop coughing. °· You cough up thick or bloody spit (mucus).   °· You feel sick to your stomach (nauseous), throw up (vomit), or have belly (abdominal) pain.   °· Your pain gets worse and medicine does not help.   °MAKE SURE YOU:  °· Understand these instructions. °· Will watch your condition. °· Will get help right away if you are not doing well or get worse. °  °This information is not intended to replace advice given to you by your health care provider. Make sure you discuss any questions you have with your health care provider. °  °Document Released: 12/06/2007 Document Revised: 06/23/2012 Document Reviewed: 04/30/2012 °Elsevier Interactive Patient Education ©2016 [employer] ° °

## 2015-09-16 DIAGNOSIS — Z85820 Personal history of malignant melanoma of skin: Secondary | ICD-10-CM | POA: Diagnosis not present

## 2015-09-16 DIAGNOSIS — D1801 Hemangioma of skin and subcutaneous tissue: Secondary | ICD-10-CM | POA: Diagnosis not present

## 2015-09-16 DIAGNOSIS — L570 Actinic keratosis: Secondary | ICD-10-CM | POA: Diagnosis not present

## 2015-09-16 DIAGNOSIS — L0889 Other specified local infections of the skin and subcutaneous tissue: Secondary | ICD-10-CM | POA: Diagnosis not present

## 2015-09-16 DIAGNOSIS — L400 Psoriasis vulgaris: Secondary | ICD-10-CM | POA: Diagnosis not present

## 2015-09-16 DIAGNOSIS — D692 Other nonthrombocytopenic purpura: Secondary | ICD-10-CM | POA: Diagnosis not present

## 2015-09-16 DIAGNOSIS — L298 Other pruritus: Secondary | ICD-10-CM | POA: Diagnosis not present

## 2015-09-16 DIAGNOSIS — L738 Other specified follicular disorders: Secondary | ICD-10-CM | POA: Diagnosis not present

## 2015-09-16 DIAGNOSIS — L821 Other seborrheic keratosis: Secondary | ICD-10-CM | POA: Diagnosis not present

## 2015-09-21 ENCOUNTER — Other Ambulatory Visit: Payer: Self-pay | Admitting: Internal Medicine

## 2015-09-21 ENCOUNTER — Other Ambulatory Visit: Payer: Self-pay | Admitting: Obstetrics and Gynecology

## 2015-09-21 DIAGNOSIS — Z1231 Encounter for screening mammogram for malignant neoplasm of breast: Secondary | ICD-10-CM

## 2015-09-27 ENCOUNTER — Encounter: Payer: Self-pay | Admitting: *Deleted

## 2015-09-27 NOTE — Progress Notes (Signed)
[facility]  Clinical Social Work was referred by patient to review and complete healthcare advance directives.  Clinical Social Worker met with patient and patients husband in [place] office.  The patient designated Freddy Jaksch Castello as their primary healthcare agent and XEE HOLLMAN as their secondary agent.  Patient also completed healthcare living will.    Clinical Social Worker notarized documents and made copies for patient/family. Clinical Social Worker will send documents to medical records to be scanned into patient's chart. Clinical Social Worker encouraged patient/family to contact with any additional questions or concerns.  Johnnye Lana, MSW, LCSW, OSW-C Clinical Social Worker [facility] [phone]

## 2015-10-11 ENCOUNTER — Ambulatory Visit: Payer: Medicare Other

## 2015-11-16 ENCOUNTER — Other Ambulatory Visit: Payer: Medicare Other

## 2015-11-16 ENCOUNTER — Ambulatory Visit: Payer: Medicare Other | Admitting: Internal Medicine

## 2015-11-17 DIAGNOSIS — D225 Melanocytic nevi of trunk: Secondary | ICD-10-CM | POA: Diagnosis not present

## 2015-11-17 DIAGNOSIS — L400 Psoriasis vulgaris: Secondary | ICD-10-CM | POA: Diagnosis not present

## 2015-11-17 DIAGNOSIS — L821 Other seborrheic keratosis: Secondary | ICD-10-CM | POA: Diagnosis not present

## 2015-11-17 DIAGNOSIS — L570 Actinic keratosis: Secondary | ICD-10-CM | POA: Diagnosis not present

## 2015-11-17 DIAGNOSIS — L718 Other rosacea: Secondary | ICD-10-CM | POA: Diagnosis not present

## 2015-11-17 DIAGNOSIS — D1801 Hemangioma of skin and subcutaneous tissue: Secondary | ICD-10-CM | POA: Diagnosis not present

## 2015-11-17 DIAGNOSIS — L814 Other melanin hyperpigmentation: Secondary | ICD-10-CM | POA: Diagnosis not present

## 2015-11-17 DIAGNOSIS — Z85820 Personal history of malignant melanoma of skin: Secondary | ICD-10-CM | POA: Diagnosis not present

## 2015-11-17 DIAGNOSIS — L738 Other specified follicular disorders: Secondary | ICD-10-CM | POA: Diagnosis not present

## 2015-11-22 ENCOUNTER — Ambulatory Visit
Admission: RE | Admit: 2015-11-22 | Discharge: 2015-11-22 | Disposition: A | Discharge status: Home / Self Care | Payer: Medicare Other | Source: Ambulatory Visit | Attending: Internal Medicine | Admitting: Internal Medicine

## 2015-11-22 DIAGNOSIS — Z1231 Encounter for screening mammogram for malignant neoplasm of breast: Secondary | ICD-10-CM

## 2015-11-24 ENCOUNTER — Ambulatory Visit: Payer: Medicare Other

## 2015-11-24 DIAGNOSIS — F411 Generalized anxiety disorder: Secondary | ICD-10-CM | POA: Diagnosis not present

## 2015-11-24 DIAGNOSIS — I10 Essential (primary) hypertension: Secondary | ICD-10-CM | POA: Diagnosis not present

## 2015-11-24 DIAGNOSIS — E119 Type 2 diabetes mellitus without complications: Secondary | ICD-10-CM | POA: Diagnosis not present

## 2015-11-24 DIAGNOSIS — F321 Major depressive disorder, single episode, moderate: Secondary | ICD-10-CM | POA: Diagnosis not present

## 2015-11-24 DIAGNOSIS — E039 Hypothyroidism, unspecified: Secondary | ICD-10-CM | POA: Diagnosis not present

## 2015-11-24 DIAGNOSIS — Z6830 Body mass index (BMI) 30.0-30.9, adult: Secondary | ICD-10-CM | POA: Diagnosis not present

## 2015-11-24 DIAGNOSIS — J302 Other seasonal allergic rhinitis: Secondary | ICD-10-CM | POA: Diagnosis not present

## 2015-11-24 DIAGNOSIS — E7800 Pure hypercholesterolemia, unspecified: Secondary | ICD-10-CM | POA: Diagnosis not present

## 2015-11-24 DIAGNOSIS — M8580 Other specified disorders of bone density and structure, unspecified site: Secondary | ICD-10-CM | POA: Diagnosis not present

## 2015-11-24 DIAGNOSIS — Z23 Encounter for immunization: Secondary | ICD-10-CM | POA: Diagnosis not present

## 2016-03-09 DIAGNOSIS — D225 Melanocytic nevi of trunk: Secondary | ICD-10-CM | POA: Diagnosis not present

## 2016-03-09 DIAGNOSIS — D1801 Hemangioma of skin and subcutaneous tissue: Secondary | ICD-10-CM | POA: Diagnosis not present

## 2016-03-09 DIAGNOSIS — L400 Psoriasis vulgaris: Secondary | ICD-10-CM | POA: Diagnosis not present

## 2016-03-09 DIAGNOSIS — L821 Other seborrheic keratosis: Secondary | ICD-10-CM | POA: Diagnosis not present

## 2016-03-09 DIAGNOSIS — L570 Actinic keratosis: Secondary | ICD-10-CM | POA: Diagnosis not present

## 2016-03-09 DIAGNOSIS — D692 Other nonthrombocytopenic purpura: Secondary | ICD-10-CM | POA: Diagnosis not present

## 2016-03-09 DIAGNOSIS — Z85820 Personal history of malignant melanoma of skin: Secondary | ICD-10-CM | POA: Diagnosis not present

## 2016-03-09 DIAGNOSIS — L918 Other hypertrophic disorders of the skin: Secondary | ICD-10-CM | POA: Diagnosis not present

## 2016-04-14 ENCOUNTER — Ambulatory Visit
Admission: EM | Admit: 2016-04-14 | Discharge: 2016-04-14 | Disposition: A | Discharge status: Home / Self Care | Payer: Medicare Other | Attending: Family Medicine | Admitting: Family Medicine

## 2016-04-14 ENCOUNTER — Encounter: Payer: Self-pay | Admitting: Emergency Medicine

## 2016-04-14 DIAGNOSIS — R6889 Other general symptoms and signs: Secondary | ICD-10-CM

## 2016-04-14 MED ORDER — OSELTAMIVIR PHOSPHATE 75 MG PO CAPS: 75.0000 mg | ORAL_CAPSULE | Freq: Two times a day (BID) | ORAL | 0 refills | Status: DC

## 2016-04-14 MED ORDER — BENZONATATE 200 MG PO CAPS: ORAL_CAPSULE | ORAL | 0 refills | Status: DC

## 2016-04-14 NOTE — ED Provider Notes (Signed)
CSN: [id number]     Arrival date & time 04/14/16  [id number] History   First MD Initiated Contact with Patient 04/14/16 [phone]     Chief Complaint  Patient presents with  . Cough  . Fever  . Generalized Body Aches   (Consider location/radiation/quality/duration/timing/severity/associated sxs/prior Treatment) HPI  This is a 71 year old female who presents with cough chest congestion fever body aches that started on Thursday  prior to this visit. States that she does not have any energy and just seems to hurt all over. She ran a low-grade fever last night and her temperature today is 99.3.       Past Medical History:  Diagnosis Date  . Allergy    seasonal, pcn  . Cancer ([place]) 2011, 2017   melanoma on back, rt shoulder  . Diabetes mellitus without complication ([place])   . Environmental allergies   . Hypertension   . Hypothyroidism   . PONV (postoperative nausea and vomiting)   . Psoriasis   . Rosacea    Past Surgical History:  Procedure Laterality Date  . ABDOMINAL HYSTERECTOMY  1984  . APPENDECTOMY    . BACK SURGERY     lumbar  . CARPAL TUNNEL RELEASE  1986  . CESAREAN SECTION     x2  . CHOLECYSTECTOMY N/A 07/14/2015   Procedure: LAPAROSCOPIC CHOLECYSTECTOMY WITH INTRAOPERATIVE CHOLANGIOGRAM;  Surgeon: Erroll Luna, MD;  Location: [place];  Service: General;  Laterality: N/A;  . DIAGNOSTIC LAPAROSCOPY    . DILATION AND CURETTAGE OF UTERUS  1974  . [place]  . [place]  . MELANOMA EXCISION  11-2009  . MELANOMA EXCISION WITH SENTINEL LYMPH NODE BIOPSY Right 04/21/2015   Procedure: MELANOMA EXCISION RIGHT SHOULDER WITH SENTINEL LYMPH NODE BIOPSY;  Surgeon: Erroll Luna, MD;  Location: [place];  Service: General;  Laterality: Right;   Sentinel node mapping right neck  . TONSILLECTOMY     Family History  Problem Relation Age of Onset  . Colon cancer Brother   . Liver disease Brother   . Pancreatic cancer  Brother   . Heart disease Mother   . Anemia Mother   . Heart disease Father    Social History  Substance Use Topics  . Smoking status: Former Research scientist (life sciences)  . Smokeless tobacco: Never Used  . Alcohol use No   OB History    No data available     Review of Systems  Constitutional: Positive for activity change, chills, fatigue and fever.  HENT: Positive for congestion and postnasal drip.   Respiratory: Positive for cough. Negative for shortness of breath, wheezing and stridor.   Musculoskeletal: Positive for myalgias.  All other systems reviewed and are negative.   Allergies  Biaxin [clarithromycin]; Penicillins; Sulfamethoxazole; and Soriatane [acitretin]  Home Medications   Prior to Admission medications   Medication Sig Start Date End Date Taking? Authorizing Provider  doxycycline (ADOXA) 100 MG tablet Take 100 mg by mouth daily.   Yes Historical Provider, MD  sertraline (ZOLOFT) 100 MG tablet Take 100 mg by mouth daily.   Yes Historical Provider, MD  aspirin EC 81 MG tablet Take 81 mg by mouth daily.    Historical Provider, MD  benzonatate (TESSALON) 200 MG capsule Take one cap TID PRN cough 04/14/16   Lorin Picket, PA-C  calcipotriene-betamethasone (TACLONEX) ointment Apply topically 2 (two) times daily. For 6 weeks    Historical Provider, MD  Calcium Carbonate-Vitamin D (CALTRATE 600+D PO)  Take by mouth.    Historical Provider, MD  FINACEA 15 % cream  03/10/15   Historical Provider, MD  levothyroxine (SYNTHROID, LEVOTHROID) 100 MCG tablet Take 100 mcg by mouth daily before breakfast.    Historical Provider, MD  lisinopril-hydrochlorothiazide (PRINZIDE,ZESTORETIC) 10-12.5 MG per tablet Take 1 tablet by mouth daily.    Historical Provider, MD  montelukast (SINGULAIR) 10 MG tablet Take 10 mg by mouth daily.    Historical Provider, MD  ondansetron (ZOFRAN ODT) 4 MG disintegrating tablet Take 1 tablet (4 mg total) by mouth every 8 (eight) hours as needed for nausea or vomiting.  07/14/15   Erroll Luna, MD  [facility] DELICA LANCETS [id number] MISC TEST SUGAR ONCE D 05/13/15   Historical Provider, MD  [facility] VERIO test strip TEST ONCE D 03/12/15   Historical Provider, MD  oseltamivir (TAMIFLU) 75 MG capsule Take 1 capsule (75 mg total) by mouth every 12 (twelve) hours. 04/14/16   Lorin Picket, PA-C  triamcinolone cream (KENALOG) 0.1 %  02/23/15   Historical Provider, MD   Meds Ordered and Administered this Visit  Medications - No data to display  BP 127/65 (BP Location: Right Arm)   Pulse 79   Temp 99.3 F (37.4 C) (Oral)   Resp 16   Ht 4\' 11"  (1.499 m)   Wt 168 lb (76.2 kg)   SpO2 97%   BMI 33.93 kg/m  No data found.   Physical Exam  Constitutional: She is oriented to person, place, and time. She appears well-developed and well-nourished. No distress.  HENT:  Head: Normocephalic and atraumatic.  Right Ear: External ear normal.  Left Ear: External ear normal.  Nose: Nose normal.  Mouth/Throat: Oropharynx is clear and moist. No oropharyngeal exudate.  Eyes: EOM are normal. Pupils are equal, round, and reactive to light. Right eye exhibits no discharge. Left eye exhibits no discharge.  Neck: Normal range of motion. Neck supple.  Pulmonary/Chest: Effort normal and breath sounds normal. No respiratory distress. She has no wheezes. She has no rales.  Musculoskeletal: Normal range of motion.  Lymphadenopathy:    She has no cervical adenopathy.  Neurological: She is alert and oriented to person, place, and time.  Skin: Skin is warm and dry. She is not diaphoretic.  Psychiatric: She has a normal mood and affect. Her behavior is normal. Judgment and thought content normal.  Nursing note and vitals reviewed.   Urgent Care Course     Procedures (including critical care time)  Labs Review Labs Reviewed - No data to display  Imaging Review No results found.   Visual Acuity Review  Right Eye Distance:   Left Eye Distance:   Bilateral Distance:    Right  Eye Near:   Left Eye Near:    Bilateral Near:         MDM   1. Flu-like symptoms    Discharge Medication List as of 04/14/2016  9:28 AM    START taking these medications   Details  benzonatate (TESSALON) 200 MG capsule Take one cap TID PRN cough, Normal    oseltamivir (TAMIFLU) 75 MG capsule Take 1 capsule (75 mg total) by mouth every 12 (twelve) hours., Starting Sat 04/14/2016, Normal      Plan: 1. Test/x-ray results and diagnosis reviewed with patient 2. rx as per orders; risks, benefits, potential side effects reviewed with patient 3. Recommend supportive treatment with Rest and fluids. Tylenol or Motrin for pain body aches and fever.Follow Up with primary care physician if  she is not improving 4. F/u prn if symptoms worsen or don't improve     Lorin Picket, PA-C 04/14/16 [phone]

## 2016-04-14 NOTE — ED Triage Notes (Signed)
Patient c/o cough, chest congestion, fever and bodyaches that started on Friday.

## 2016-05-16 DIAGNOSIS — M859 Disorder of bone density and structure, unspecified: Secondary | ICD-10-CM | POA: Diagnosis not present

## 2016-05-16 DIAGNOSIS — E038 Other specified hypothyroidism: Secondary | ICD-10-CM | POA: Diagnosis not present

## 2016-05-16 DIAGNOSIS — E7800 Pure hypercholesterolemia, unspecified: Secondary | ICD-10-CM | POA: Diagnosis not present

## 2016-05-16 DIAGNOSIS — E119 Type 2 diabetes mellitus without complications: Secondary | ICD-10-CM | POA: Diagnosis not present

## 2016-05-23 DIAGNOSIS — E7800 Pure hypercholesterolemia, unspecified: Secondary | ICD-10-CM | POA: Diagnosis not present

## 2016-05-23 DIAGNOSIS — F321 Major depressive disorder, single episode, moderate: Secondary | ICD-10-CM | POA: Diagnosis not present

## 2016-05-23 DIAGNOSIS — L719 Rosacea, unspecified: Secondary | ICD-10-CM | POA: Diagnosis not present

## 2016-05-23 DIAGNOSIS — F411 Generalized anxiety disorder: Secondary | ICD-10-CM | POA: Diagnosis not present

## 2016-05-23 DIAGNOSIS — Z6834 Body mass index (BMI) 34.0-34.9, adult: Secondary | ICD-10-CM | POA: Diagnosis not present

## 2016-05-23 DIAGNOSIS — Z1389 Encounter for screening for other disorder: Secondary | ICD-10-CM | POA: Diagnosis not present

## 2016-05-23 DIAGNOSIS — J302 Other seasonal allergic rhinitis: Secondary | ICD-10-CM | POA: Diagnosis not present

## 2016-05-23 DIAGNOSIS — Z0000 Encounter for general adult medical examination without abnormal findings: Secondary | ICD-10-CM | POA: Diagnosis not present

## 2016-05-23 DIAGNOSIS — I4460 Unspecified fascicular block: Secondary | ICD-10-CM | POA: Diagnosis not present

## 2016-05-23 DIAGNOSIS — E119 Type 2 diabetes mellitus without complications: Secondary | ICD-10-CM | POA: Diagnosis not present

## 2016-05-23 DIAGNOSIS — C439 Malignant melanoma of skin, unspecified: Secondary | ICD-10-CM | POA: Diagnosis not present

## 2016-05-25 DIAGNOSIS — Z1212 Encounter for screening for malignant neoplasm of rectum: Secondary | ICD-10-CM | POA: Diagnosis not present

## 2016-07-25 DIAGNOSIS — Z85820 Personal history of malignant melanoma of skin: Secondary | ICD-10-CM | POA: Diagnosis not present

## 2016-07-25 DIAGNOSIS — D485 Neoplasm of uncertain behavior of skin: Secondary | ICD-10-CM | POA: Diagnosis not present

## 2016-07-25 DIAGNOSIS — L400 Psoriasis vulgaris: Secondary | ICD-10-CM | POA: Diagnosis not present

## 2016-07-25 DIAGNOSIS — L570 Actinic keratosis: Secondary | ICD-10-CM | POA: Diagnosis not present

## 2016-07-25 DIAGNOSIS — D1801 Hemangioma of skin and subcutaneous tissue: Secondary | ICD-10-CM | POA: Diagnosis not present

## 2016-07-25 DIAGNOSIS — L820 Inflamed seborrheic keratosis: Secondary | ICD-10-CM | POA: Diagnosis not present

## 2016-07-25 DIAGNOSIS — L821 Other seborrheic keratosis: Secondary | ICD-10-CM | POA: Diagnosis not present

## 2016-08-10 DIAGNOSIS — Z85820 Personal history of malignant melanoma of skin: Secondary | ICD-10-CM | POA: Diagnosis not present

## 2016-11-09 DIAGNOSIS — L570 Actinic keratosis: Secondary | ICD-10-CM | POA: Diagnosis not present

## 2016-11-09 DIAGNOSIS — Z85820 Personal history of malignant melanoma of skin: Secondary | ICD-10-CM | POA: Diagnosis not present

## 2016-11-09 DIAGNOSIS — L119 Acantholytic disorder, unspecified: Secondary | ICD-10-CM | POA: Diagnosis not present

## 2016-11-09 DIAGNOSIS — D485 Neoplasm of uncertain behavior of skin: Secondary | ICD-10-CM | POA: Diagnosis not present

## 2016-11-15 ENCOUNTER — Other Ambulatory Visit: Payer: Self-pay | Admitting: Internal Medicine

## 2016-11-15 DIAGNOSIS — Z1231 Encounter for screening mammogram for malignant neoplasm of breast: Secondary | ICD-10-CM

## 2016-11-28 ENCOUNTER — Ambulatory Visit: Payer: Medicare Other

## 2016-11-28 DIAGNOSIS — E038 Other specified hypothyroidism: Secondary | ICD-10-CM | POA: Diagnosis not present

## 2016-11-28 DIAGNOSIS — E1149 Type 2 diabetes mellitus with other diabetic neurological complication: Secondary | ICD-10-CM | POA: Diagnosis not present

## 2016-11-28 DIAGNOSIS — F411 Generalized anxiety disorder: Secondary | ICD-10-CM | POA: Diagnosis not present

## 2016-11-28 DIAGNOSIS — Z23 Encounter for immunization: Secondary | ICD-10-CM | POA: Diagnosis not present

## 2016-11-28 DIAGNOSIS — I10 Essential (primary) hypertension: Secondary | ICD-10-CM | POA: Diagnosis not present

## 2016-11-28 DIAGNOSIS — Z6836 Body mass index (BMI) 36.0-36.9, adult: Secondary | ICD-10-CM | POA: Diagnosis not present

## 2016-11-28 DIAGNOSIS — E7800 Pure hypercholesterolemia, unspecified: Secondary | ICD-10-CM | POA: Diagnosis not present

## 2016-11-28 DIAGNOSIS — M859 Disorder of bone density and structure, unspecified: Secondary | ICD-10-CM | POA: Diagnosis not present

## 2016-11-28 DIAGNOSIS — F321 Major depressive disorder, single episode, moderate: Secondary | ICD-10-CM | POA: Diagnosis not present

## 2016-11-30 DIAGNOSIS — E119 Type 2 diabetes mellitus without complications: Secondary | ICD-10-CM | POA: Diagnosis not present

## 2017-01-02 ENCOUNTER — Other Ambulatory Visit: Payer: Self-pay | Admitting: Internal Medicine

## 2017-01-02 DIAGNOSIS — Z139 Encounter for screening, unspecified: Secondary | ICD-10-CM

## 2017-01-28 ENCOUNTER — Ambulatory Visit
Admission: RE | Admit: 2017-01-28 | Discharge: 2017-01-28 | Disposition: A | Discharge status: Home / Self Care | Payer: Medicare Other | Source: Ambulatory Visit | Attending: Internal Medicine | Admitting: Internal Medicine

## 2017-01-28 DIAGNOSIS — Z139 Encounter for screening, unspecified: Secondary | ICD-10-CM

## 2017-01-28 DIAGNOSIS — Z1231 Encounter for screening mammogram for malignant neoplasm of breast: Secondary | ICD-10-CM | POA: Diagnosis not present

## 2017-01-29 ENCOUNTER — Ambulatory Visit: Payer: Medicare Other

## 2017-03-29 DIAGNOSIS — D225 Melanocytic nevi of trunk: Secondary | ICD-10-CM | POA: Diagnosis not present

## 2017-03-29 DIAGNOSIS — D1801 Hemangioma of skin and subcutaneous tissue: Secondary | ICD-10-CM | POA: Diagnosis not present

## 2017-03-29 DIAGNOSIS — L950 Livedoid vasculitis: Secondary | ICD-10-CM | POA: Diagnosis not present

## 2017-03-29 DIAGNOSIS — D692 Other nonthrombocytopenic purpura: Secondary | ICD-10-CM | POA: Diagnosis not present

## 2017-03-29 DIAGNOSIS — L820 Inflamed seborrheic keratosis: Secondary | ICD-10-CM | POA: Diagnosis not present

## 2017-03-29 DIAGNOSIS — L821 Other seborrheic keratosis: Secondary | ICD-10-CM | POA: Diagnosis not present

## 2017-03-29 DIAGNOSIS — L570 Actinic keratosis: Secondary | ICD-10-CM | POA: Diagnosis not present

## 2017-03-29 DIAGNOSIS — L400 Psoriasis vulgaris: Secondary | ICD-10-CM | POA: Diagnosis not present

## 2017-03-29 DIAGNOSIS — Z85820 Personal history of malignant melanoma of skin: Secondary | ICD-10-CM | POA: Diagnosis not present

## 2017-03-29 DIAGNOSIS — L814 Other melanin hyperpigmentation: Secondary | ICD-10-CM | POA: Diagnosis not present

## 2017-03-29 DIAGNOSIS — D485 Neoplasm of uncertain behavior of skin: Secondary | ICD-10-CM | POA: Diagnosis not present

## 2017-05-23 DIAGNOSIS — E7800 Pure hypercholesterolemia, unspecified: Secondary | ICD-10-CM | POA: Diagnosis not present

## 2017-05-23 DIAGNOSIS — E1149 Type 2 diabetes mellitus with other diabetic neurological complication: Secondary | ICD-10-CM | POA: Diagnosis not present

## 2017-05-23 DIAGNOSIS — I10 Essential (primary) hypertension: Secondary | ICD-10-CM | POA: Diagnosis not present

## 2017-05-23 DIAGNOSIS — R82998 Other abnormal findings in urine: Secondary | ICD-10-CM | POA: Diagnosis not present

## 2017-05-23 DIAGNOSIS — E038 Other specified hypothyroidism: Secondary | ICD-10-CM | POA: Diagnosis not present

## 2017-05-23 DIAGNOSIS — M859 Disorder of bone density and structure, unspecified: Secondary | ICD-10-CM | POA: Diagnosis not present

## 2017-05-30 DIAGNOSIS — F411 Generalized anxiety disorder: Secondary | ICD-10-CM | POA: Diagnosis not present

## 2017-05-30 DIAGNOSIS — I4460 Unspecified fascicular block: Secondary | ICD-10-CM | POA: Diagnosis not present

## 2017-05-30 DIAGNOSIS — E1149 Type 2 diabetes mellitus with other diabetic neurological complication: Secondary | ICD-10-CM | POA: Diagnosis not present

## 2017-05-30 DIAGNOSIS — I10 Essential (primary) hypertension: Secondary | ICD-10-CM | POA: Diagnosis not present

## 2017-05-30 DIAGNOSIS — Z6837 Body mass index (BMI) 37.0-37.9, adult: Secondary | ICD-10-CM | POA: Diagnosis not present

## 2017-05-30 DIAGNOSIS — M859 Disorder of bone density and structure, unspecified: Secondary | ICD-10-CM | POA: Diagnosis not present

## 2017-05-30 DIAGNOSIS — E7800 Pure hypercholesterolemia, unspecified: Secondary | ICD-10-CM | POA: Diagnosis not present

## 2017-05-30 DIAGNOSIS — Z0000 Encounter for general adult medical examination without abnormal findings: Secondary | ICD-10-CM | POA: Diagnosis not present

## 2017-05-30 DIAGNOSIS — E038 Other specified hypothyroidism: Secondary | ICD-10-CM | POA: Diagnosis not present

## 2017-05-30 DIAGNOSIS — Z1389 Encounter for screening for other disorder: Secondary | ICD-10-CM | POA: Diagnosis not present

## 2017-05-30 DIAGNOSIS — J302 Other seasonal allergic rhinitis: Secondary | ICD-10-CM | POA: Diagnosis not present

## 2017-06-05 DIAGNOSIS — Z1212 Encounter for screening for malignant neoplasm of rectum: Secondary | ICD-10-CM | POA: Diagnosis not present

## 2017-08-07 DIAGNOSIS — L400 Psoriasis vulgaris: Secondary | ICD-10-CM | POA: Diagnosis not present

## 2017-08-07 DIAGNOSIS — L820 Inflamed seborrheic keratosis: Secondary | ICD-10-CM | POA: Diagnosis not present

## 2017-08-07 DIAGNOSIS — Z85820 Personal history of malignant melanoma of skin: Secondary | ICD-10-CM | POA: Diagnosis not present

## 2017-08-14 DIAGNOSIS — Z85820 Personal history of malignant melanoma of skin: Secondary | ICD-10-CM | POA: Diagnosis not present

## 2017-08-14 DIAGNOSIS — M7981 Nontraumatic hematoma of soft tissue: Secondary | ICD-10-CM | POA: Diagnosis not present

## 2017-08-14 DIAGNOSIS — L400 Psoriasis vulgaris: Secondary | ICD-10-CM | POA: Diagnosis not present

## 2017-09-27 DIAGNOSIS — Z85820 Personal history of malignant melanoma of skin: Secondary | ICD-10-CM | POA: Diagnosis not present

## 2017-09-27 DIAGNOSIS — D692 Other nonthrombocytopenic purpura: Secondary | ICD-10-CM | POA: Diagnosis not present

## 2017-09-27 DIAGNOSIS — L720 Epidermal cyst: Secondary | ICD-10-CM | POA: Diagnosis not present

## 2017-09-27 DIAGNOSIS — L400 Psoriasis vulgaris: Secondary | ICD-10-CM | POA: Diagnosis not present

## 2017-09-27 DIAGNOSIS — L821 Other seborrheic keratosis: Secondary | ICD-10-CM | POA: Diagnosis not present

## 2017-09-27 DIAGNOSIS — L814 Other melanin hyperpigmentation: Secondary | ICD-10-CM | POA: Diagnosis not present

## 2017-09-27 DIAGNOSIS — L570 Actinic keratosis: Secondary | ICD-10-CM | POA: Diagnosis not present

## 2017-11-06 DIAGNOSIS — L720 Epidermal cyst: Secondary | ICD-10-CM | POA: Diagnosis not present

## 2017-11-06 DIAGNOSIS — L400 Psoriasis vulgaris: Secondary | ICD-10-CM | POA: Diagnosis not present

## 2017-11-06 DIAGNOSIS — L570 Actinic keratosis: Secondary | ICD-10-CM | POA: Diagnosis not present

## 2017-11-06 DIAGNOSIS — Z85820 Personal history of malignant melanoma of skin: Secondary | ICD-10-CM | POA: Diagnosis not present

## 2017-11-28 DIAGNOSIS — E119 Type 2 diabetes mellitus without complications: Secondary | ICD-10-CM | POA: Diagnosis not present

## 2018-01-23 DIAGNOSIS — Z23 Encounter for immunization: Secondary | ICD-10-CM | POA: Diagnosis not present

## 2018-02-12 DIAGNOSIS — F321 Major depressive disorder, single episode, moderate: Secondary | ICD-10-CM | POA: Diagnosis not present

## 2018-02-12 DIAGNOSIS — Z6837 Body mass index (BMI) 37.0-37.9, adult: Secondary | ICD-10-CM | POA: Diagnosis not present

## 2018-02-12 DIAGNOSIS — E1149 Type 2 diabetes mellitus with other diabetic neurological complication: Secondary | ICD-10-CM | POA: Diagnosis not present

## 2018-02-12 DIAGNOSIS — F411 Generalized anxiety disorder: Secondary | ICD-10-CM | POA: Diagnosis not present

## 2018-02-12 DIAGNOSIS — L03115 Cellulitis of right lower limb: Secondary | ICD-10-CM | POA: Diagnosis not present

## 2018-02-12 DIAGNOSIS — I10 Essential (primary) hypertension: Secondary | ICD-10-CM | POA: Diagnosis not present

## 2018-02-12 DIAGNOSIS — E038 Other specified hypothyroidism: Secondary | ICD-10-CM | POA: Diagnosis not present

## 2018-02-12 DIAGNOSIS — M859 Disorder of bone density and structure, unspecified: Secondary | ICD-10-CM | POA: Diagnosis not present

## 2018-02-12 DIAGNOSIS — E7800 Pure hypercholesterolemia, unspecified: Secondary | ICD-10-CM | POA: Diagnosis not present

## 2018-02-23 ENCOUNTER — Ambulatory Visit
Admission: EM | Admit: 2018-02-23 | Discharge: 2018-02-23 | Disposition: A | Discharge status: Home / Self Care | Payer: Medicare Other | Attending: Emergency Medicine | Admitting: Emergency Medicine

## 2018-02-23 ENCOUNTER — Other Ambulatory Visit: Payer: Self-pay

## 2018-02-23 ENCOUNTER — Ambulatory Visit: Payer: Medicare Other

## 2018-02-23 ENCOUNTER — Encounter: Payer: Self-pay | Admitting: Gynecology

## 2018-02-23 DIAGNOSIS — M11262 Other chondrocalcinosis, left knee: Secondary | ICD-10-CM | POA: Insufficient documentation

## 2018-02-23 DIAGNOSIS — E119 Type 2 diabetes mellitus without complications: Secondary | ICD-10-CM | POA: Diagnosis not present

## 2018-02-23 DIAGNOSIS — Z79899 Other long term (current) drug therapy: Secondary | ICD-10-CM | POA: Insufficient documentation

## 2018-02-23 DIAGNOSIS — I10 Essential (primary) hypertension: Secondary | ICD-10-CM | POA: Diagnosis not present

## 2018-02-23 DIAGNOSIS — Z87891 Personal history of nicotine dependence: Secondary | ICD-10-CM | POA: Insufficient documentation

## 2018-02-23 DIAGNOSIS — M1120 Other chondrocalcinosis, unspecified site: Secondary | ICD-10-CM | POA: Diagnosis not present

## 2018-02-23 DIAGNOSIS — Z85820 Personal history of malignant melanoma of skin: Secondary | ICD-10-CM | POA: Insufficient documentation

## 2018-02-23 DIAGNOSIS — Z8249 Family history of ischemic heart disease and other diseases of the circulatory system: Secondary | ICD-10-CM | POA: Insufficient documentation

## 2018-02-23 DIAGNOSIS — Z791 Long term (current) use of non-steroidal anti-inflammatories (NSAID): Secondary | ICD-10-CM | POA: Diagnosis not present

## 2018-02-23 DIAGNOSIS — M25562 Pain in left knee: Secondary | ICD-10-CM | POA: Insufficient documentation

## 2018-02-23 DIAGNOSIS — L409 Psoriasis, unspecified: Secondary | ICD-10-CM | POA: Diagnosis not present

## 2018-02-23 DIAGNOSIS — Z7982 Long term (current) use of aspirin: Secondary | ICD-10-CM | POA: Insufficient documentation

## 2018-02-23 DIAGNOSIS — E039 Hypothyroidism, unspecified: Secondary | ICD-10-CM | POA: Diagnosis not present

## 2018-02-23 MED ORDER — NAPROXEN 375 MG PO TABS: 375.0000 mg | ORAL_TABLET | Freq: Two times a day (BID) | ORAL | 0 refills | Status: DC

## 2018-02-23 NOTE — ED Triage Notes (Signed)
Patient c/ x 1 week with left knee / foot pain. Patient deny any injury to her left foot.

## 2018-02-23 NOTE — Discharge Instructions (Addendum)
Take the Naprosyn with 1 g of Tylenol together twice a day for the next 7 to 10 days.  I would recommend getting a cane to help you get around.  Go immediately to the ER for fevers above 100.4, if your knee turns red, hot, chest pain, shortness of breath, or for any other concerns.

## 2018-02-23 NOTE — ED Provider Notes (Signed)
HPI  SUBJECTIVE:  [name] is a 72 y.o. female who presents with 1 week of atraumatic left knee/lower extremity pain.  She describes the pain as intermittent, sharp, located along the posterior medial knee.  She reports distal lower extremity edema.  She tried rest with improvement in her symptoms, symptoms worse with getting up, walking.  No chest pain, shortness of breath, hemoptysis.  No fevers, erythema in knee.  No surgery in the past 4 weeks, prolonged immobilization.  She has never had symptoms like this before.  She has a past medical history of melanoma, psoriasis.  No history of DVT, PE, Baker's cyst.  DVV:[id number], Fransico Him, MD  Past Medical History:  Diagnosis Date  . Allergy    seasonal, pcn  . Cancer ([place]) 2011, 2017   melanoma on back, rt shoulder  . Diabetes mellitus without complication ([place])   . Environmental allergies   . Hypertension   . Hypothyroidism   . PONV (postoperative nausea and vomiting)   . Psoriasis   . Rosacea     Past Surgical History:  Procedure Laterality Date  . ABDOMINAL HYSTERECTOMY  1984  . APPENDECTOMY    . BACK SURGERY     lumbar  . CARPAL TUNNEL RELEASE  1986  . CESAREAN SECTION     x2  . CHOLECYSTECTOMY N/A 07/14/2015   Procedure: LAPAROSCOPIC CHOLECYSTECTOMY WITH INTRAOPERATIVE CHOLANGIOGRAM;  Surgeon: Erroll Luna, MD;  Location: [place];  Service: General;  Laterality: N/A;  . DIAGNOSTIC LAPAROSCOPY    . DILATION AND CURETTAGE OF UTERUS  1974  . [place]  . [place]  . MELANOMA EXCISION  11-2009  . MELANOMA EXCISION WITH SENTINEL LYMPH NODE BIOPSY Right 04/21/2015   Procedure: MELANOMA EXCISION RIGHT SHOULDER WITH SENTINEL LYMPH NODE BIOPSY;  Surgeon: Erroll Luna, MD;  Location: [place];  Service: General;  Laterality: Right;   Sentinel node mapping right neck  . TONSILLECTOMY      Family History  Problem Relation Age of Onset  . Colon cancer  Brother   . Liver disease Brother   . Pancreatic cancer Brother   . Heart disease Mother   . Anemia Mother   . Heart disease Father     Social History   Tobacco Use  . Smoking status: Former Research scientist (life sciences)  . Smokeless tobacco: Never Used  Substance Use Topics  . Alcohol use: No  . Drug use: No    No current facility-administered medications for this encounter.   Current Outpatient Medications:  .  aspirin EC 81 MG tablet, Take 81 mg by mouth daily., Disp: , Rfl:  .  calcipotriene-betamethasone (TACLONEX) ointment, Apply topically 2 (two) times daily. For 6 weeks, Disp: , Rfl:  .  Calcium Carbonate-Vitamin D (CALTRATE 600+D PO), Take by mouth., Disp: , Rfl:  .  doxycycline (ADOXA) 100 MG tablet, Take 100 mg by mouth daily., Disp: , Rfl:  .  levothyroxine (SYNTHROID, LEVOTHROID) 100 MCG tablet, Take 100 mcg by mouth daily before breakfast., Disp: , Rfl:  .  lisinopril-hydrochlorothiazide (PRINZIDE,ZESTORETIC) 10-12.5 MG per tablet, Take 1 tablet by mouth daily., Disp: , Rfl:  .  montelukast (SINGULAIR) 10 MG tablet, Take 10 mg by mouth daily., Disp: , Rfl:  .  ONETOUCH DELICA LANCETS 10G MISC, TEST SUGAR ONCE D, Disp: , Rfl: 4 .  ONETOUCH VERIO test strip, TEST ONCE D, Disp: , Rfl: 3 .  sertraline (ZOLOFT) 100 MG tablet, Take 100  mg by mouth daily., Disp: , Rfl:  .  FINACEA 15 % cream, , Disp: , Rfl:  .  naproxen (NAPROSYN) 375 MG tablet, Take 1 tablet (375 mg total) by mouth 2 (two) times daily., Disp: 20 tablet, Rfl: 0  Allergies  Allergen Reactions  . Biaxin [Clarithromycin] Nausea Only  . Penicillins     Unknown reaction  . Sulfamethoxazole Nausea Only  . Soriatane [Acitretin] Rash     ROS  As noted in HPI.   Physical Exam  BP 139/65 (BP Location: Left Arm)   Pulse 61   Temp 98.1 F (36.7 C) (Oral)   Resp 16   Ht 4\' 11"  (1.499 m)   Wt 81.6 kg   SpO2 100%   BMI 36.36 kg/m   Constitutional: Well developed, well nourished, no acute distress Eyes:  EOMI,  conjunctiva normal bilaterally HENT: Normocephalic, atraumatic,mucus membranes moist Respiratory: Normal inspiratory effort Cardiovascular: Normal rate GI: nondistended skin: No rash, skin intact Musculoskeletal: L Knee ROM baseline for PT, Flexion  Intact, no crepitus with range of motion, tenderness entire joint, Patella tender, Patellar tendon NT, Medial joint tender, Lateral joint tender, Popliteal region tender, no palpable cord, Lachman's stable, Varus LCL stress testing stable, Valgus MCL stress testing stable, McMurray's testing normal, distal NVI with intact baseline sensation / motor / pulse distal to knee. No effusion. No erythema. No increased temperature.  Calves measure 40 cm right calf, 41.5 cm left calf.  Bilateral psoriasis.  trace pitting edema bilaterally. Neurologic: Alert & oriented x 3, no focal neuro deficits Psychiatric: Speech and behavior appropriate   ED Course   Medications - No data to display  Orders Placed This Encounter  Procedures  . DG Knee AP/LAT W/Sunrise Left    Standing Status:   Standing    Number of Occurrences:   1    Order Specific Question:   Reason for Exam (SYMPTOM  OR DIAGNOSIS REQUIRED)    Answer:   atraumatic knee pain r/o acute changes    No results found for this or any previous visit (from the past 24 hour(s)). Dg Knee Ap/lat W/sunrise Left  Result Date: 02/23/2018 CLINICAL DATA:  Left knee pain for 1 week EXAM: LEFT KNEE 3 VIEWS COMPARISON:  None. FINDINGS: No fracture or dislocation of the left knee. There is a small amount of femorotibial chondrocalcinosis. Joint spaces are otherwise normal. IMPRESSION: 1. No acute osseous injury of the left knee. 2. 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 Electronically Signed   By: Ulyses Jarred M.D.   On: 02/23/2018 16:26    ED Clinical Impression  Pseudogout  Acute pain of left knee   ED Assessment/Plan  Reviewed  imaging independently.  Chondrocalcinosis.  See radiology report for full details.  Presentation consistent with pseudogout.  Doubt DVT.  No evidence of septic joint.  Will send home with 375 mg naprosyn combined with 1 g of Tylenol twice daily for a week to 10 days.  Follow-up with [facility] if not better in a week.  To the ER if she gets worse, fevers, or for other concerns.  Discussed labs, imaging, MDM, treatment plan, and plan for follow-up with patient. Discussed sn/sx that should prompt return to the ED. patient agrees with plan.   Meds ordered this encounter  Medications  . naproxen (NAPROSYN) 375 MG tablet    Sig: Take 1 tablet (375 mg total) by mouth 2 (two) times daily.    Dispense:  20 tablet    Refill:  0    *This clinic note was created using [employer]. Therefore, there may be occasional mistakes despite careful proofreading.   ?   Melynda Ripple, MD 02/23/18 1730

## 2018-02-28 ENCOUNTER — Ambulatory Visit
Admission: RE | Admit: 2018-02-28 | Discharge: 2018-02-28 | Disposition: A | Discharge status: Home / Self Care | Payer: Medicare Other | Source: Ambulatory Visit | Attending: Dermatology | Admitting: Dermatology

## 2018-02-28 ENCOUNTER — Other Ambulatory Visit: Payer: Self-pay | Admitting: Dermatology

## 2018-02-28 ENCOUNTER — Other Ambulatory Visit (HOSPITAL_COMMUNITY): Payer: Self-pay | Admitting: Dermatology

## 2018-02-28 DIAGNOSIS — Z85820 Personal history of malignant melanoma of skin: Secondary | ICD-10-CM | POA: Diagnosis not present

## 2018-02-28 DIAGNOSIS — M7989 Other specified soft tissue disorders: Secondary | ICD-10-CM

## 2018-02-28 DIAGNOSIS — M79604 Pain in right leg: Secondary | ICD-10-CM | POA: Insufficient documentation

## 2018-02-28 DIAGNOSIS — L400 Psoriasis vulgaris: Secondary | ICD-10-CM | POA: Diagnosis not present

## 2018-02-28 DIAGNOSIS — R600 Localized edema: Secondary | ICD-10-CM | POA: Diagnosis not present

## 2018-03-13 ENCOUNTER — Other Ambulatory Visit: Payer: Self-pay | Admitting: Internal Medicine

## 2018-03-13 DIAGNOSIS — Z1231 Encounter for screening mammogram for malignant neoplasm of breast: Secondary | ICD-10-CM

## 2018-03-14 DIAGNOSIS — M1712 Unilateral primary osteoarthritis, left knee: Secondary | ICD-10-CM | POA: Diagnosis not present

## 2018-04-04 DIAGNOSIS — L814 Other melanin hyperpigmentation: Secondary | ICD-10-CM | POA: Diagnosis not present

## 2018-04-04 DIAGNOSIS — L723 Sebaceous cyst: Secondary | ICD-10-CM | POA: Diagnosis not present

## 2018-04-04 DIAGNOSIS — D1801 Hemangioma of skin and subcutaneous tissue: Secondary | ICD-10-CM | POA: Diagnosis not present

## 2018-04-04 DIAGNOSIS — L570 Actinic keratosis: Secondary | ICD-10-CM | POA: Diagnosis not present

## 2018-04-04 DIAGNOSIS — L400 Psoriasis vulgaris: Secondary | ICD-10-CM | POA: Diagnosis not present

## 2018-04-04 DIAGNOSIS — Z85820 Personal history of malignant melanoma of skin: Secondary | ICD-10-CM | POA: Diagnosis not present

## 2018-04-04 DIAGNOSIS — L821 Other seborrheic keratosis: Secondary | ICD-10-CM | POA: Diagnosis not present

## 2018-04-04 DIAGNOSIS — D692 Other nonthrombocytopenic purpura: Secondary | ICD-10-CM | POA: Diagnosis not present

## 2018-04-10 ENCOUNTER — Ambulatory Visit
Admission: RE | Admit: 2018-04-10 | Discharge: 2018-04-10 | Disposition: A | Discharge status: Home / Self Care | Payer: Medicare Other | Source: Ambulatory Visit | Attending: Internal Medicine | Admitting: Internal Medicine

## 2018-04-10 DIAGNOSIS — Z1231 Encounter for screening mammogram for malignant neoplasm of breast: Secondary | ICD-10-CM | POA: Diagnosis not present

## 2018-05-10 ENCOUNTER — Encounter: Payer: Self-pay | Admitting: Gastroenterology

## 2018-07-09 DIAGNOSIS — E038 Other specified hypothyroidism: Secondary | ICD-10-CM | POA: Diagnosis not present

## 2018-07-09 DIAGNOSIS — E7800 Pure hypercholesterolemia, unspecified: Secondary | ICD-10-CM | POA: Diagnosis not present

## 2018-07-09 DIAGNOSIS — M1990 Unspecified osteoarthritis, unspecified site: Secondary | ICD-10-CM | POA: Diagnosis not present

## 2018-07-09 DIAGNOSIS — M859 Disorder of bone density and structure, unspecified: Secondary | ICD-10-CM | POA: Diagnosis not present

## 2018-07-09 DIAGNOSIS — E1149 Type 2 diabetes mellitus with other diabetic neurological complication: Secondary | ICD-10-CM | POA: Diagnosis not present

## 2018-07-09 DIAGNOSIS — I10 Essential (primary) hypertension: Secondary | ICD-10-CM | POA: Diagnosis not present

## 2018-07-16 DIAGNOSIS — F321 Major depressive disorder, single episode, moderate: Secondary | ICD-10-CM | POA: Diagnosis not present

## 2018-07-16 DIAGNOSIS — Z0000 Encounter for general adult medical examination without abnormal findings: Secondary | ICD-10-CM | POA: Diagnosis not present

## 2018-07-16 DIAGNOSIS — E039 Hypothyroidism, unspecified: Secondary | ICD-10-CM | POA: Diagnosis not present

## 2018-07-16 DIAGNOSIS — J302 Other seasonal allergic rhinitis: Secondary | ICD-10-CM | POA: Diagnosis not present

## 2018-07-16 DIAGNOSIS — M1990 Unspecified osteoarthritis, unspecified site: Secondary | ICD-10-CM | POA: Diagnosis not present

## 2018-07-16 DIAGNOSIS — E7800 Pure hypercholesterolemia, unspecified: Secondary | ICD-10-CM | POA: Diagnosis not present

## 2018-07-16 DIAGNOSIS — F411 Generalized anxiety disorder: Secondary | ICD-10-CM | POA: Diagnosis not present

## 2018-07-16 DIAGNOSIS — Z1331 Encounter for screening for depression: Secondary | ICD-10-CM | POA: Diagnosis not present

## 2018-07-16 DIAGNOSIS — E1149 Type 2 diabetes mellitus with other diabetic neurological complication: Secondary | ICD-10-CM | POA: Diagnosis not present

## 2018-07-16 DIAGNOSIS — L409 Psoriasis, unspecified: Secondary | ICD-10-CM | POA: Diagnosis not present

## 2018-07-16 DIAGNOSIS — M8580 Other specified disorders of bone density and structure, unspecified site: Secondary | ICD-10-CM | POA: Diagnosis not present

## 2018-07-16 DIAGNOSIS — I10 Essential (primary) hypertension: Secondary | ICD-10-CM | POA: Diagnosis not present

## 2018-09-04 DIAGNOSIS — Z85820 Personal history of malignant melanoma of skin: Secondary | ICD-10-CM | POA: Diagnosis not present

## 2018-09-04 DIAGNOSIS — I10 Essential (primary) hypertension: Secondary | ICD-10-CM | POA: Diagnosis not present

## 2018-09-04 DIAGNOSIS — R82998 Other abnormal findings in urine: Secondary | ICD-10-CM | POA: Diagnosis not present

## 2018-09-04 DIAGNOSIS — L720 Epidermal cyst: Secondary | ICD-10-CM | POA: Diagnosis not present

## 2018-12-18 DIAGNOSIS — L723 Sebaceous cyst: Secondary | ICD-10-CM | POA: Diagnosis not present

## 2018-12-18 DIAGNOSIS — L814 Other melanin hyperpigmentation: Secondary | ICD-10-CM | POA: Diagnosis not present

## 2018-12-18 DIAGNOSIS — L718 Other rosacea: Secondary | ICD-10-CM | POA: Diagnosis not present

## 2018-12-18 DIAGNOSIS — D225 Melanocytic nevi of trunk: Secondary | ICD-10-CM | POA: Diagnosis not present

## 2018-12-18 DIAGNOSIS — Z23 Encounter for immunization: Secondary | ICD-10-CM | POA: Diagnosis not present

## 2018-12-18 DIAGNOSIS — L821 Other seborrheic keratosis: Secondary | ICD-10-CM | POA: Diagnosis not present

## 2018-12-18 DIAGNOSIS — Z85820 Personal history of malignant melanoma of skin: Secondary | ICD-10-CM | POA: Diagnosis not present

## 2018-12-18 DIAGNOSIS — L400 Psoriasis vulgaris: Secondary | ICD-10-CM | POA: Diagnosis not present

## 2019-05-28 ENCOUNTER — Other Ambulatory Visit: Payer: Self-pay | Admitting: Internal Medicine

## 2019-05-28 DIAGNOSIS — Z1231 Encounter for screening mammogram for malignant neoplasm of breast: Secondary | ICD-10-CM

## 2019-06-20 ENCOUNTER — Other Ambulatory Visit: Payer: Self-pay

## 2019-06-20 ENCOUNTER — Ambulatory Visit
Admission: EM | Admit: 2019-06-20 | Discharge: 2019-06-20 | Disposition: A | Discharge status: Home / Self Care | Payer: Medicare Other | Attending: Orthopedic Surgery | Admitting: Orthopedic Surgery

## 2019-06-20 ENCOUNTER — Ambulatory Visit: Payer: Medicare Other

## 2019-06-20 DIAGNOSIS — S81811A Laceration without foreign body, right lower leg, initial encounter: Secondary | ICD-10-CM | POA: Diagnosis not present

## 2019-06-20 DIAGNOSIS — W268XXA Contact with other sharp object(s), not elsewhere classified, initial encounter: Secondary | ICD-10-CM

## 2019-06-20 DIAGNOSIS — S8011XA Contusion of right lower leg, initial encounter: Secondary | ICD-10-CM | POA: Diagnosis not present

## 2019-06-20 MED ORDER — CEPHALEXIN 500 MG PO CAPS: 500.0000 mg | ORAL_CAPSULE | Freq: Four times a day (QID) | ORAL | 0 refills | Status: AC

## 2019-06-20 NOTE — Discharge Instructions (Addendum)
Please keep dressing on right lower extremity for 3 days.  Try to keep lower leg elevated as much as possible.  Return to the ER for any severe pain, swelling, numbness, tingling, worsening symptoms or urgent changes in your health.  Please remove dressing in 3 days and apply Vaseline gauze over skin tears.  The sutures that are placed will dissolve on their own.  Please follow-up with primary care provider or urgent care in a few days for recheck.

## 2019-06-20 NOTE — ED Triage Notes (Signed)
Pt presents with c/o laceration and skin tear to lower right leg/shin area. Pt states she was carrying a table earlier and dropped it, it hit her leg and slid down her shin. Pt is ambulatory. Pt reports significant pain to area. Pt states her tetanus is up to date.

## 2019-06-20 NOTE — ED Provider Notes (Signed)
[facility]    CSN: [id number] Arrival date & time: 06/20/19  1305      History   Chief Complaint Chief Complaint  Patient presents with  . Extremity Laceration    right lower leg/shin, 06/20/19    HPI [name] is a 74 y.o. female presents to the urgent care facility for evaluation of right leg and shin swelling, laceration.  She was carrying a table earlier and dropped it, table slid down her shin and she suffered a laceration with a skin tear.  She has developed a small hematoma.  She is ambulatory with very little pain.  Tetanus is up-to-date.  Ankle plantarflexion dorsiflexion is intact.  No numbness or tingling.  She is not on blood thinners.  She has very thin skin from psoriasis creams  HPI  Past Medical History:  Diagnosis Date  . Allergy    seasonal, pcn  . Cancer ([place]) 2011, 2017   melanoma on back, rt shoulder  . Diabetes mellitus without complication ([place])   . Environmental allergies   . Hypertension   . Hypothyroidism   . PONV (postoperative nausea and vomiting)   . Psoriasis   . Rosacea     Patient Active Problem List   Diagnosis Date Noted  . 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 ABDOMINAL HYSTERECTOMY  1984  . APPENDECTOMY    . BACK SURGERY     lumbar  . CARPAL TUNNEL RELEASE  1986  . CESAREAN SECTION     x2  . CHOLECYSTECTOMY N/A 07/14/2015   Procedure: LAPAROSCOPIC CHOLECYSTECTOMY WITH INTRAOPERATIVE CHOLANGIOGRAM;  Surgeon: Erroll Luna, MD;  Location: [place];  Service: General;  Laterality: N/A;  . DIAGNOSTIC LAPAROSCOPY    . DILATION AND CURETTAGE OF UTERUS  1974  . [place]  . [place]  . MELANOMA EXCISION  11-2009  . MELANOMA EXCISION WITH SENTINEL LYMPH NODE BIOPSY Right 04/21/2015   Procedure: MELANOMA EXCISION RIGHT SHOULDER WITH SENTINEL LYMPH NODE BIOPSY;  Surgeon: Erroll Luna, MD;   Location: [place];  Service: General;  Laterality: Right;   Sentinel node mapping right neck  . TONSILLECTOMY      OB History   No obstetric history on file.      Home Medications    Prior to Admission medications   Medication Sig Start Date End Date Taking? Authorizing Provider  aspirin EC 81 MG tablet Take 81 mg by mouth daily.   Yes [provider]  calcipotriene-betamethasone (TACLONEX) ointment Apply topically 2 (two) times daily. For 6 weeks   Yes [provider]  Calcium Carbonate-Vitamin D (CALTRATE 600+D PO) Take by mouth.   Yes [provider]  levothyroxine (SYNTHROID, LEVOTHROID) 100 MCG tablet Take 100 mcg by mouth daily before breakfast.   Yes [provider]  lisinopril-hydrochlorothiazide (PRINZIDE,ZESTORETIC) 10-12.5 MG per tablet Take 1 tablet by mouth daily.   Yes [provider]  montelukast (SINGULAIR) 10 MG tablet Take 10 mg by mouth daily.   Yes [provider]  Jonetta Speak LANCETS [id number] MISC TEST SUGAR ONCE D 05/13/15  Yes [provider]  [facility] VERIO test strip TEST ONCE D 03/12/15  Yes [provider]  sertraline (ZOLOFT) 100 MG tablet Take 100 mg by mouth daily.   Yes [provider]  cephALEXin (KEFLEX) 500 MG capsule Take 1 capsule (500 mg total) by  mouth 4 (four) times daily for 7 days. 06/20/19 06/27/19  Duanne Guess, PA-C  doxycycline (ADOXA) 100 MG tablet Take 100 mg by mouth daily.    [provider]  FINACEA 15 % cream  03/10/15   [provider]  naproxen (NAPROSYN) 375 MG tablet Take 1 tablet (375 mg total) by mouth 2 (two) times daily. 02/23/18   Melynda Ripple, MD    Family History Family History  Problem Relation Age of Onset  . Colon cancer Brother   . Liver disease Brother   . Pancreatic cancer Brother   . Heart disease Mother   . Anemia Mother   . Heart disease Father     Social History Social History    Tobacco Use  . Smoking status: Former Research scientist (life sciences)  . Smokeless tobacco: Never Used  Substance Use Topics  . Alcohol use: No  . Drug use: No     Allergies   Biaxin [clarithromycin], Penicillins, Sulfamethoxazole, and Soriatane [acitretin]   Review of Systems Review of Systems  Constitutional: Negative for fever.  Musculoskeletal: Positive for myalgias. Negative for arthralgias, gait problem and joint swelling.  Skin: Positive for wound. Negative for color change and rash.  Neurological: Negative for numbness.     Physical Exam Triage Vital Signs ED Triage Vitals  Enc Vitals Group     BP 06/20/19 1324 (!) 128/97     Pulse Rate 06/20/19 1324 82     Resp 06/20/19 1324 18     Temp 06/20/19 1324 98 F (36.7 C)     Temp Source 06/20/19 1324 Oral     SpO2 06/20/19 1324 96 %     Weight --      Height 06/20/19 1321 4\' 11"  (1.499 m)     Head Circumference --      Peak Flow --      Pain Score 06/20/19 1320 10     Pain Loc --      Pain Edu? --      Excl. in [place]? --    No data found.  Updated Vital Signs BP (!) 128/97 (BP Location: Left Arm)   Pulse 82   Temp 98 F (36.7 C) (Oral)   Resp 18   Ht 4\' 11"  (1.499 m)   SpO2 96%   BMI 36.36 kg/m   Visual Acuity Right Eye Distance:   Left Eye Distance:   Bilateral Distance:    Right Eye Near:   Left Eye Near:    Bilateral Near:     Physical Exam Constitutional:      Appearance: She is well-developed.  HENT:     Head: Normocephalic and atraumatic.  Eyes:     Conjunctiva/sclera: Conjunctivae normal.  Cardiovascular:     Rate and Rhythm: Normal rate.  Pulmonary:     Effort: Pulmonary effort is normal. No respiratory distress.  Musculoskeletal:        General: Normal range of motion.     Cervical back: Normal range of motion.     Comments: Patient is able to straight leg raise the right lower extremity.  She has a 6 cm skin tear along the anterior mid shin just below it with a 3 cm laceration.  Just medial to 3 cm  laceration there is a hematoma present that is tender and fluctuant, stable in size.  Ankle plantarflexion dorsiflexion is intact.  No pain throughout the calf.  Compartments are soft.  Skin:    General: Skin is warm.  Findings: No rash.  Neurological:     Mental Status: She is alert and oriented to person, place, and time.  Psychiatric:        Behavior: Behavior normal.        Thought Content: Thought content normal.      UC Treatments / Results  Labs (all labs ordered are listed, but only abnormal results are displayed) Labs Reviewed - No data to display  EKG   Radiology DG Tibia/Fibula Right  Result Date: 06/20/2019 CLINICAL DATA:  Trauma to right leg with laceration lower leg. EXAM: RIGHT TIBIA AND FIBULA - 2 VIEW COMPARISON:  None. FINDINGS: There is no evidence of fracture or other focal bone lesions. Evidence of patient's soft tissue laceration over the anterior proximal to mid aspect of the lower leg. IMPRESSION: No focal bony abnormality. Soft tissue injury over the anterior lower leg. Electronically Signed   By: Marin Olp M.D.   On: 06/20/2019 14:11    Procedures Laceration Repair  Date/Time: 06/20/2019 3:00 PM Performed by: Duanne Guess, PA-C Authorized by: Duanne Guess, PA-C   Consent:    Consent obtained:  Verbal   Consent given by:  Patient   Risks discussed:  Infection, pain, retained foreign body, poor cosmetic result and poor wound healing Anesthesia (see MAR for exact dosages):    Anesthesia method:  Local infiltration and topical application   Topical anesthetic:  LET   Local anesthetic:  Lidocaine 1% WITH epi Laceration details:    Location: Right anterior shin.   Length (cm):  3   Depth (mm):  3 Repair type:    Repair type:  Simple Exploration:    Hemostasis achieved with:  Direct pressure   Wound exploration: entire depth of wound probed and visualized     Contaminated: no   Treatment:    Area cleansed with:  Saline and  Betadine   Amount of cleaning:  Extensive   Irrigation solution:  Sterile saline   Irrigation method:  Pressure wash   Visualized foreign bodies/material removed: no   Skin repair:    Repair method:  Sutures   Suture size:  4-0   Suture material:  Fast-absorbing gut   Suture technique:  Simple interrupted and running locked   Number of sutures:  5 Approximation:    Approximation:  Close Post-procedure details:    Dressing:  Sterile dressing and bulky dressing   Patient tolerance of procedure:  Tolerated well, no immediate complications Comments:     Small 3 cm laceration repaired with running Vicryl stitch.  Bleeding controlled.  Skin tear reinforced with Steri-Strips.  Bulky dressing then applied throughout the lower leg.  Bleeding controlled.   (including critical care time)  Medications Ordered in UC Medications - No data to display  Initial Impression / Assessment and Plan / UC Course  I have reviewed the triage vital signs and the nursing notes.  Pertinent labs & imaging results that were available during my care of the patient were reviewed by me and considered in my medical decision making (see chart for details).    74 year old female with right leg laceration.  X-ray showed no evidence of acute bony abnormality or foreign body.  Laceration repaired with 4-0 Vicryl sutures with a running stitch.  Skin tear repaired with Steri-Strips.  Patient educated on wound care.  She is placed on prophylactic antibiotics.  She understands signs symptoms return to clinic for. Final Clinical Impressions(s) / UC Diagnoses   Final diagnoses:  Noninfected  skin tear of lower extremity, right, initial encounter  Laceration of right lower extremity, initial encounter  Hematoma of right lower extremity, initial encounter     Discharge Instructions     Please keep dressing on right lower extremity for 3 days.  Try to keep lower leg elevated as much as possible.  Return to the ER for any  severe pain, swelling, numbness, tingling, worsening symptoms or urgent changes in your health.  Please remove dressing in 3 days and apply Vaseline gauze over skin tears.  The sutures that are placed will dissolve on their own.  Please follow-up with primary care provider or urgent care in a few days for recheck.   ED Prescriptions    Medication Sig Dispense Auth. Provider   cephALEXin (KEFLEX) 500 MG capsule Take 1 capsule (500 mg total) by mouth 4 (four) times daily for 7 days. 28 capsule Duanne Guess, PA-C     PDMP not reviewed this encounter.   Duanne Guess, [place] 06/20/19 1503

## 2019-06-23 DIAGNOSIS — L409 Psoriasis, unspecified: Secondary | ICD-10-CM | POA: Diagnosis not present

## 2019-06-23 DIAGNOSIS — S81811A Laceration without foreign body, right lower leg, initial encounter: Secondary | ICD-10-CM | POA: Diagnosis not present

## 2019-06-23 DIAGNOSIS — S8011XA Contusion of right lower leg, initial encounter: Secondary | ICD-10-CM | POA: Diagnosis not present

## 2019-06-26 DIAGNOSIS — S8011XD Contusion of right lower leg, subsequent encounter: Secondary | ICD-10-CM | POA: Diagnosis not present

## 2019-06-26 DIAGNOSIS — S81811D Laceration without foreign body, right lower leg, subsequent encounter: Secondary | ICD-10-CM | POA: Diagnosis not present

## 2019-07-01 DIAGNOSIS — S81811D Laceration without foreign body, right lower leg, subsequent encounter: Secondary | ICD-10-CM | POA: Diagnosis not present

## 2019-07-01 DIAGNOSIS — L03115 Cellulitis of right lower limb: Secondary | ICD-10-CM | POA: Diagnosis not present

## 2019-07-01 DIAGNOSIS — E1149 Type 2 diabetes mellitus with other diabetic neurological complication: Secondary | ICD-10-CM | POA: Diagnosis not present

## 2019-07-01 DIAGNOSIS — L409 Psoriasis, unspecified: Secondary | ICD-10-CM | POA: Diagnosis not present

## 2019-07-01 DIAGNOSIS — S8011XD Contusion of right lower leg, subsequent encounter: Secondary | ICD-10-CM | POA: Diagnosis not present

## 2019-07-06 DIAGNOSIS — I10 Essential (primary) hypertension: Secondary | ICD-10-CM | POA: Diagnosis not present

## 2019-07-06 DIAGNOSIS — S8011XD Contusion of right lower leg, subsequent encounter: Secondary | ICD-10-CM | POA: Diagnosis not present

## 2019-07-06 DIAGNOSIS — L03115 Cellulitis of right lower limb: Secondary | ICD-10-CM | POA: Diagnosis not present

## 2019-07-06 DIAGNOSIS — S81811D Laceration without foreign body, right lower leg, subsequent encounter: Secondary | ICD-10-CM | POA: Diagnosis not present

## 2019-07-13 ENCOUNTER — Other Ambulatory Visit: Payer: Self-pay

## 2019-07-13 ENCOUNTER — Ambulatory Visit
Admission: RE | Admit: 2019-07-13 | Discharge: 2019-07-13 | Disposition: A | Discharge status: Home / Self Care | Payer: Medicare Other | Source: Ambulatory Visit | Attending: Internal Medicine | Admitting: Internal Medicine

## 2019-07-13 DIAGNOSIS — M8589 Other specified disorders of bone density and structure, multiple sites: Secondary | ICD-10-CM | POA: Diagnosis not present

## 2019-07-13 DIAGNOSIS — E1149 Type 2 diabetes mellitus with other diabetic neurological complication: Secondary | ICD-10-CM | POA: Diagnosis not present

## 2019-07-13 DIAGNOSIS — Z1231 Encounter for screening mammogram for malignant neoplasm of breast: Secondary | ICD-10-CM | POA: Diagnosis not present

## 2019-07-13 DIAGNOSIS — R300 Dysuria: Secondary | ICD-10-CM | POA: Diagnosis not present

## 2019-07-13 DIAGNOSIS — L03115 Cellulitis of right lower limb: Secondary | ICD-10-CM | POA: Diagnosis not present

## 2019-07-13 DIAGNOSIS — E038 Other specified hypothyroidism: Secondary | ICD-10-CM | POA: Diagnosis not present

## 2019-07-13 DIAGNOSIS — S8011XD Contusion of right lower leg, subsequent encounter: Secondary | ICD-10-CM | POA: Diagnosis not present

## 2019-07-13 DIAGNOSIS — E7800 Pure hypercholesterolemia, unspecified: Secondary | ICD-10-CM | POA: Diagnosis not present

## 2019-07-13 DIAGNOSIS — M859 Disorder of bone density and structure, unspecified: Secondary | ICD-10-CM | POA: Diagnosis not present

## 2019-07-13 DIAGNOSIS — S81811D Laceration without foreign body, right lower leg, subsequent encounter: Secondary | ICD-10-CM | POA: Diagnosis not present

## 2019-07-13 DIAGNOSIS — D72829 Elevated white blood cell count, unspecified: Secondary | ICD-10-CM | POA: Diagnosis not present

## 2019-07-14 ENCOUNTER — Ambulatory Visit: Payer: Medicare Other

## 2019-07-17 ENCOUNTER — Encounter: Payer: Medicare Other | Attending: Physician Assistant | Admitting: Physician Assistant

## 2019-07-17 ENCOUNTER — Other Ambulatory Visit: Payer: Self-pay

## 2019-07-17 DIAGNOSIS — S81801A Unspecified open wound, right lower leg, initial encounter: Secondary | ICD-10-CM | POA: Insufficient documentation

## 2019-07-17 DIAGNOSIS — E11622 Type 2 diabetes mellitus with other skin ulcer: Secondary | ICD-10-CM | POA: Diagnosis not present

## 2019-07-17 DIAGNOSIS — E11628 Type 2 diabetes mellitus with other skin complications: Secondary | ICD-10-CM | POA: Diagnosis not present

## 2019-07-17 DIAGNOSIS — I10 Essential (primary) hypertension: Secondary | ICD-10-CM | POA: Diagnosis not present

## 2019-07-17 DIAGNOSIS — X58XXXA Exposure to other specified factors, initial encounter: Secondary | ICD-10-CM | POA: Diagnosis not present

## 2019-07-20 ENCOUNTER — Other Ambulatory Visit: Payer: Self-pay

## 2019-07-20 DIAGNOSIS — I10 Essential (primary) hypertension: Secondary | ICD-10-CM | POA: Diagnosis not present

## 2019-07-20 DIAGNOSIS — E11622 Type 2 diabetes mellitus with other skin ulcer: Secondary | ICD-10-CM | POA: Diagnosis not present

## 2019-07-20 DIAGNOSIS — S81801A Unspecified open wound, right lower leg, initial encounter: Secondary | ICD-10-CM | POA: Diagnosis not present

## 2019-07-20 NOTE — Progress Notes (Signed)
SYLA, KINIRY ([id number]) Visit Report for 07/17/2019 Allergy List Details Patient Name: [name], [name] Date of Service: 07/17/2019 2:15 PM Medical Record Number: [id number] Patient Account Number: [id number] Date of Birth/Sex: February 19, 1946 (74 y.o. F) Treating RN: Army Melia Primary Care Ikeem Cleckler: Domenick Gong Other Clinician: Referring Jarryn Altland: Claud Kelp Treating Willene Holian/Extender: STONE III, HOYT Weeks in Treatment: 0 Allergies Active Allergies penicillin Allergy Notes Electronic Signature(s) Signed: 07/20/2019 9:21:34 AM By: Army Melia Entered By: Army Melia on 07/17/2019 14:26:35 [name] Kitchen ([id number]) -------------------------------------------------------------------------------- Arrival Information Details Patient Name: [name], Tayley C. Date of Service: 07/17/2019 2:15 PM Medical Record Number: [id number] Patient Account Number: [id number] Date of Birth/Sex: 1945/10/10 (74 y.o. F) Treating RN: Montey Hora Primary Care Kerriann Kamphuis: Domenick Gong Other Clinician: Referring Opie Maclaughlin: Claud Kelp Treating Stanely Sexson/Extender: Melburn Hake, HOYT Weeks in Treatment: 0 Visit Information Patient Arrived: Ambulatory Arrival Time: 14:09 Accompanied By: self Transfer Assistance: None Patient Identification Verified: Yes Secondary Verification Process Completed: Yes Patient Has Alerts: Yes Patient Alerts: DMII Electronic Signature(s) Signed: 07/17/2019 3:24:15 PM By: Montey Hora Previous Signature: 07/17/2019 2:37:45 PM Version By: Lorine Bears RCP, RRT, CHT Entered By: Montey Hora on 07/17/2019 14:44:04 [name], [name] ([id number]) -------------------------------------------------------------------------------- Clinic Level of Care Assessment Details Patient Name: [name], [name] Date of Service: 07/17/2019 2:15 PM Medical Record Number: [id number] Patient Account Number: [id number] Date of Birth/Sex: Jan 28, 1946 (74 y.o. F) Treating RN:  Montey Hora Primary Care Ezelle Surprenant: Domenick Gong Other Clinician: Referring Paschal Blanton: Claud Kelp Treating Todd Argabright/Extender: Melburn Hake, HOYT Weeks in Treatment: 0 Clinic Level of Care Assessment Items TOOL 2 Quantity Score []  - Use when only an EandM is performed on the INITIAL visit 0 ASSESSMENTS - Nursing Assessment / Reassessment X - General Physical Exam (combine w/ comprehensive assessment (listed just below) when performed on new 1 20 pt. evals) X- 1 25 Comprehensive Assessment (HX, ROS, Risk Assessments, Wounds Hx, etc.) ASSESSMENTS - Wound and Skin Assessment / Reassessment X - Simple Wound Assessment / Reassessment - one wound 1 5 []  - 0 Complex Wound Assessment / Reassessment - multiple wounds []  - 0 Dermatologic / Skin Assessment (not related to wound area) ASSESSMENTS - Ostomy and/or Continence Assessment and Care []  - Incontinence Assessment and Management 0 []  - 0 Ostomy Care Assessment and Management (repouching, etc.) PROCESS - Coordination of Care X - Simple Patient / Family Education for ongoing care 1 15 []  - 0 Complex (extensive) Patient / Family Education for ongoing care X- 1 10 Staff obtains Programmer, systems, Records, Test Results / Process Orders []  - 0 Staff telephones HHA, Nursing Homes / Clarify orders / etc []  - 0 Routine Transfer to another Facility (non-emergent condition) []  - 0 Routine Hospital Admission (non-emergent condition) X- 1 15 New Admissions / Biomedical engineer / Ordering NPWT, Apligraf, etc. []  - 0 Emergency Hospital Admission (emergent condition) X- 1 10 Simple Discharge Coordination []  - 0 Complex (extensive) Discharge Coordination PROCESS - Special Needs []  - Pediatric / Minor Patient Management 0 []  - 0 Isolation Patient Management []  - 0 Hearing / Language / Visual special needs []  - 0 Assessment of Community assistance (transportation, D/C planning, etc.) []  - 0 Additional assistance / Altered mentation []  -  0 Support Surface(s) Assessment (bed, cushion, seat, etc.) INTERVENTIONS - Wound Cleansing / Measurement X - Wound Imaging (photographs - any number of wounds) 1 5 []  - 0 Wound Tracing (instead of photographs) X- 1 5 Simple Wound Measurement - one wound []  - 0 Complex Wound Measurement - multiple wounds  Bendix, Janayah C. ([id number]) X- 1 5 Simple Wound Cleansing - one wound []  - 0 Complex Wound Cleansing - multiple wounds INTERVENTIONS - Wound Dressings []  - Small Wound Dressing one or multiple wounds 0 X- 1 15 Medium Wound Dressing one or multiple wounds []  - 0 Large Wound Dressing one or multiple wounds []  - 0 Application of Medications - injection INTERVENTIONS - Miscellaneous []  - External ear exam 0 []  - 0 Specimen Collection (cultures, biopsies, blood, body fluids, etc.) []  - 0 Specimen(s) / Culture(s) sent or taken to Lab for analysis []  - 0 Patient Transfer (multiple staff / Civil Service fast streamer / Similar devices) []  - 0 Simple Staple / Suture removal (25 or less) []  - 0 Complex Staple / Suture removal (26 or more) []  - 0 Hypo / Hyperglycemic Management (close monitor of Blood Glucose) X- 1 15 Ankle / Brachial Index (ABI) - do not check if billed separately Has the patient been seen at the hospital within the last three years: Yes Total Score: 145 Level Of Care: New/Established - Level 4 Electronic Signature(s) Signed: 07/17/2019 3:24:15 PM By: Montey Hora Entered By: Montey Hora on 07/17/2019 15:01:24 [name], [name] -------------------------------------------------------------------------------- Encounter Discharge Information Details Patient Name: [name], [name] Date of Service: 07/17/2019 2:15 PM Medical Record Number: [id number] Patient Account Number: [id number] Date of Birth/Sex: 03-Oct-1945 (74 y.o. F) Treating RN: Montey Hora Primary Care Naome Brigandi: Domenick Gong Other Clinician: Referring Zema Lizardo: Claud Kelp Treating  Abner Ardis/Extender: Melburn Hake, HOYT Weeks in Treatment: 0 Encounter Discharge Information Items Discharge Condition: Stable Ambulatory Status: Ambulatory Discharge Destination: Home Transportation: Private Auto Accompanied By: self Schedule Follow-up Appointment: Yes Clinical Summary of Care: Electronic Signature(s) Signed: 07/17/2019 3:24:15 PM By: Montey Hora Entered By: Montey Hora on 07/17/2019 14:48:40 Reineck, Loana C. ([id number]) -------------------------------------------------------------------------------- Lower Extremity Assessment Details Patient Name: [name], [name] Date of Service: 07/17/2019 2:15 PM Medical Record Number: [id number] Patient Account Number: [id number] Date of Birth/Sex: 02/16/1946 (74 y.o. F) Treating RN: Army Melia Primary Care Criss Pallone: Domenick Gong Other Clinician: Referring Almin Livingstone: Claud Kelp Treating Harace Mccluney/Extender: STONE III, HOYT Weeks in Treatment: 0 Edema Assessment Assessed: [Left: No] [Right: No] Edema: [Left: No] [Right: No] Calf Left: Right: Point of Measurement: 29 cm From Medial Instep 37 cm 36 cm Ankle Left: Right: Point of Measurement: 10 cm From Medial Instep 23 cm 23 cm Vascular Assessment Pulses: Dorsalis Pedis Palpable: [Left:Yes] [Right:Yes] Doppler Audible: [Right:Yes] Posterior Tibial Palpable: [Right:Yes] Blood Pressure: Brachial: [Right:144] Dorsalis Pedis: 132 Ankle: Posterior Tibial: 160 Ankle Brachial Index: [Right:1.11] Electronic Signature(s) Signed: 07/20/2019 9:21:34 AM By: Army Melia Entered By: Army Melia on 07/17/2019 14:36:04 Pankonin, Aidan C. ([id number]) -------------------------------------------------------------------------------- Multi Wound Chart Details Patient Name: [name], [name] Date of Service: 07/17/2019 2:15 PM Medical Record Number: [id number] Patient Account Number: [id number] Date of Birth/Sex: 12-15-1945 (74 y.o. F) Treating RN: Montey Hora Primary Care  Gurleen Larrivee: Domenick Gong Other Clinician: Referring Leina Babe: Claud Kelp Treating Dvante Hands/Extender: STONE III, HOYT Weeks in Treatment: 0 Vital Signs Height(in): 59 Pulse(bpm): 65 Weight(lbs): 178 Blood Pressure(mmHg): 144/52 Body Mass Index(BMI): 36 Temperature(F): 98.6 Respiratory Rate(breaths/min): 16 Photos: [N/A:N/A] Wound Location: Right, Anterior Lower Leg N/A N/A Wounding Event: Trauma N/A N/A Primary Etiology: Trauma, Other N/A N/A Comorbid History: Hypertension, Type II Diabetes N/A N/A Date Acquired: 06/20/2019 N/A N/A Weeks of Treatment: 0 N/A N/A Wound Status: Open N/A N/A Measurements L x W x D (cm) 5.2x3.2x0.1 N/A N/A Area (cm) : 13.069 N/A N/A Volume (cm) : 1.307 N/A N/A Classification: Full Thickness Without  Exposed N/A N/A Support Structures Exudate Amount: Medium N/A N/A Exudate Type: Serosanguineous N/A N/A Exudate Color: red, brown N/A N/A Wound Margin: Flat and Intact N/A N/A Granulation Amount: Medium (34-66%) N/A N/A Granulation Quality: Red N/A N/A Necrotic Amount: Medium (34-66%) N/A N/A Necrotic Tissue: Eschar, Adherent Slough N/A N/A Exposed Structures: Fat Layer (Subcutaneous Tissue) N/A N/A Exposed: Yes Fascia: No Tendon: No Muscle: No Joint: No Bone: No Epithelialization: None N/A N/A Treatment Notes Electronic Signature(s) Signed: 07/17/2019 3:24:15 PM By: Montey Hora Entered By: Montey Hora on 07/17/2019 14:45:57 [name], [name] -------------------------------------------------------------------------------- Multi-Disciplinary Care Plan Details Patient Name: [name], [name] Date of Service: 07/17/2019 2:15 PM Medical Record Number: [id number] Patient Account Number: [id number] Date of Birth/Sex: 02/03/1946 (74 y.o. F) Treating RN: Montey Hora Primary Care Ladawn Boullion: Domenick Gong Other Clinician: Referring Lilibeth Opie: Claud Kelp Treating Jahmal Dunavant/Extender: Melburn Hake, HOYT Weeks in Treatment:  0 Active Inactive Abuse / Safety / Falls / Self Care Management Nursing Diagnoses: Potential for falls Goals: Patient will not experience any injury related to falls Date Initiated: 07/17/2019 Target Resolution Date: 10/17/2019 Goal Status: Active Interventions: Assess fall risk on admission and as needed Notes: Necrotic Tissue Nursing Diagnoses: Impaired tissue integrity related to necrotic/devitalized tissue Goals: Necrotic/devitalized tissue will be minimized in the wound bed Date Initiated: 07/17/2019 Target Resolution Date: 10/17/2019 Goal Status: Active Interventions: Provide education on necrotic tissue and debridement process Notes: Orientation to the Wound Care Program Nursing Diagnoses: Knowledge deficit related to the wound healing center program Goals: Patient/caregiver will verbalize understanding of the [facility] Date Initiated: 07/17/2019 Target Resolution Date: 10/17/2019 Goal Status: Active Interventions: Provide education on orientation to the wound center Notes: Wound/Skin Impairment Nursing Diagnoses: Impaired tissue integrity Goals: Ulcer/skin breakdown will heal within 14 weeks Date Initiated: 07/17/2019 Target Resolution Date: 10/17/2019 Goal Status: Active [name], QUI ([id number]) Interventions: Assess patient/caregiver ability to obtain necessary supplies Assess patient/caregiver ability to perform ulcer/skin care regimen upon admission and as needed Assess ulceration(s) every visit Notes: Electronic Signature(s) Signed: 07/17/2019 3:24:15 PM By: Montey Hora Entered By: Montey Hora on 07/17/2019 14:45:46 Barletta, Fate C. ([id number]) -------------------------------------------------------------------------------- Pain Assessment Details Patient Name: [name], Hagen C. Date of Service: 07/17/2019 2:15 PM Medical Record Number: [id number] Patient Account Number: [id number] Date of Birth/Sex: 1945/08/31 (74 y.o. F) Treating RN:  Montey Hora Primary Care Cinthia Rodden: Domenick Gong Other Clinician: Referring Rashunda Passon: Claud Kelp Treating Raygan Skarda/Extender: STONE III, HOYT Weeks in Treatment: 0 Active Problems Location of Pain Severity and Description of Pain Patient Has Paino No Site Locations Pain Management and Medication Current Pain Management: Electronic Signature(s) Signed: 07/17/2019 2:37:45 PM By: Paulla Fore, RRT, CHT Signed: 07/17/2019 3:24:15 PM By: Montey Hora Entered By: Lorine Bears on 07/17/2019 14:13:45 Chriswell, Priti C. ([id number]) -------------------------------------------------------------------------------- Patient/Caregiver Education Details Patient Name: [name], Britiny C. Date of Service: 07/17/2019 2:15 PM Medical Record Number: [id number] Patient Account Number: [id number] Date of Birth/Gender: 1945/07/25 (74 y.o. F) Treating RN: Montey Hora Primary Care Physician: Domenick Gong Other Clinician: Referring Physician: Claud Kelp Treating Physician/Extender: Sharalyn Ink in Treatment: 0 Education Assessment Education Provided To: Patient Education Topics Provided Venous: Handouts: Other: leg elevation Methods: Explain/Verbal Responses: State content correctly Electronic Signature(s) Signed: 07/17/2019 3:24:15 PM By: Montey Hora Entered By: Montey Hora on 07/17/2019 15:02:02 [place], [place]. ([id number]) -------------------------------------------------------------------------------- Wound Assessment Details Patient Name: Mcgrory, Gaylon C. Date of Service: 07/17/2019 2:15 PM Medical Record Number: [id number] Patient Account Number: [id number] Date of Birth/Sex: Jul 12, 1945 (74 y.o. F) Treating RN:  Army Melia Primary Care Jaklyn Alen: Domenick Gong Other Clinician: Referring Shye Doty: Claud Kelp Treating Roshon Duell/Extender: Melburn Hake, HOYT Weeks in Treatment: 0 Wound Status Wound Number: 1 Primary Etiology: Trauma,  Other Wound Location: Right, Anterior Lower Leg Wound Status: Open Wounding Event: Trauma Comorbid History: Hypertension, Type II Diabetes Date Acquired: 06/20/2019 Weeks Of Treatment: 0 Clustered Wound: No Photos Wound Measurements Length: (cm) 5.2 Width: (cm) 3.2 Depth: (cm) 0.1 Area: (cm) 13.069 Volume: (cm) 1.307 % Reduction in Area: % Reduction in Volume: Epithelialization: None Tunneling: No Undermining: No Wound Description Classification: Full Thickness Without Exposed Support Structures Wound Margin: Flat and Intact Exudate Amount: Medium Exudate Type: Serosanguineous Exudate Color: red, brown Foul Odor After Cleansing: No Slough/Fibrino Yes Wound Bed Granulation Amount: Medium (34-66%) Exposed Structure Granulation Quality: Red Fascia Exposed: No Necrotic Amount: Medium (34-66%) Fat Layer (Subcutaneous Tissue) Exposed: Yes Necrotic Quality: Eschar, Adherent Slough Tendon Exposed: No Muscle Exposed: No Joint Exposed: No Bone Exposed: No Treatment Notes Wound #1 (Right, Anterior Lower Leg) Notes iodoform, silver collaggen, abd, conform and tubigrip f Electronic Signature(s) Signed: 07/20/2019 9:21:34 AM By: Concha Norway, Sachi C. ([id number]) Entered By: Army Melia on 07/17/2019 14:32:29 Noreen, Mirabella Loletha [name] ([id number]) -------------------------------------------------------------------------------- Vitals Details Patient Name: [name], Abie C. Date of Service: 07/17/2019 2:15 PM Medical Record Number: [id number] Patient Account Number: [id number] Date of Birth/Sex: 1945-09-03 (74 y.o. F) Treating RN: Montey Hora Primary Care Celesta Funderburk: Domenick Gong Other Clinician: Referring Eliott Amparan: Claud Kelp Treating Jamie-Lee Galdamez/Extender: STONE III, HOYT Weeks in Treatment: 0 Vital Signs Time Taken: 14:12 Temperature (F): 98.6 Height (in): 59 Pulse (bpm): 65 Source: Stated Respiratory Rate (breaths/min): 16 Weight (lbs): 178 Blood Pressure  (mmHg): 144/52 Source: Measured Reference Range: 80 - 120 mg / dl Body Mass Index (BMI): 35.9 Electronic Signature(s) Signed: 07/17/2019 2:37:45 PM By: Lorine Bears RCP, RRT, CHT Entered By: Lorine Bears on 07/17/2019 14:15:30

## 2019-07-20 NOTE — Progress Notes (Signed)
[name] ([id number]) Visit Report for 07/17/2019 Chief Complaint Document Details Patient Name: [name], [name]. Date of Service: 07/17/2019 2:15 PM Medical Record Number: [id number] Patient Account Number: [id number] Date of Birth/Sex: 27-Apr-1945 (74 y.o. F) Treating RN: [name] Primary Care Provider: Domenick [name] Other Clinician: Referring Provider: Claud [name] Treating Provider/Extender: [name], [name] Weeks in Treatment: 0 Information Obtained from: Patient Chief Complaint Right LE Ulcer secondary to trauma Electronic Signature(s) Signed: 07/17/2019 2:42:42 PM By: Worthy Keeler PA-[name] Entered By: Worthy [name] on 07/17/2019 14:42:41 Nord, Jendayi [name]. ([id number]) -------------------------------------------------------------------------------- HPI Details Patient Name: [name], [name]. Date of Service: 07/17/2019 2:15 PM Medical Record Number: [id number] Patient Account Number: [id number] Date of Birth/Sex: 1945/10/13 (74 y.o. F) Treating RN: [name] Primary Care Provider: Domenick [name] Other Clinician: Referring Provider: Claud [name] Treating Provider/Extender: [name], [name] Weeks in Treatment: 0 History of Present Illness HPI Description: 07/17/2019 upon evaluation today patient presents for initial evaluation here in our clinic concerning issues with her right lower extremity. She subsequently sustained an injury on April 10 when she dropped something onto her leg causing a skin tear/hematoma. She was seen in urgent care where this was sutured and then subsequently the sutures are still in place. She was also given doxycycline about 10 days ago but fortunately seems to be doing a lot better in that regard as far as any infection is concerned I think the doxycycline is done well. There does not appear to be any signs of active infection which is good news. No fevers, chills, nausea, vomiting, or diarrhea. The patient does have a history of diabetes and  high blood pressure but otherwise is fairly healthy. She does have some lower extremity edema and she also does have psoriasis. Electronic Signature(s) Signed: 07/17/2019 3:27:18 PM By: Worthy Keeler PA-[name] Entered By: Worthy [name] on 07/17/2019 15:27:18 Rothermel, Loxley [name]. ([id number]) -------------------------------------------------------------------------------- Physical Exam Details Patient Name: [name], Malala [name]. Date of Service: 07/17/2019 2:15 PM Medical Record Number: [id number] Patient Account Number: [id number] Date of Birth/Sex: 1945-11-22 (74 y.o. F) Treating RN: [name] Primary Care Provider: Domenick [name] Other Clinician: Referring Provider: Claud [name] Treating Provider/Extender: STONE III, Aldona [name] Weeks in Treatment: 0 Constitutional patient is hypertensive.. pulse regular and within target range for patient.Marland Kitchen respirations regular, non-labored and within target range for patient.Marland Kitchen temperature within target range for patient.. Well-nourished and well-hydrated in no acute distress. Eyes conjunctiva clear no eyelid edema noted. pupils equal round and reactive to light and accommodation. Ears, Nose, Mouth, and Throat no gross abnormality of ear auricles or external auditory canals. normal hearing noted during conversation. mucus membranes moist. Respiratory normal breathing without difficulty. Cardiovascular 2+ dorsalis pedis/posterior tibialis pulses. no clubbing, cyanosis, significant edema, <3 sec cap refill. Musculoskeletal normal gait and posture. no significant deformity or arthritic changes, no loss or range of motion, no clubbing. Psychiatric this patient is able to make decisions and demonstrates good insight into disease process. Alert and Oriented x 3. pleasant and cooperative. Notes Upon inspection patient's wound actually did require removal of the 3 sutures in place. This did open up an area where in one spot there really was not significant depth I  think little bit of collagen here will help that still up. In the other area she actually has a much deeper hematoma which is good to require some management here. I was able to use a sterile Q-tip and clean out a lot in fact I believe all of the hematoma from underneath  this region. Subsequently I did pack this with iodoform gauze dressing which also think is going to be of benefit for the patient as far as working out the drainage and preventing infection from building up due to fluid buildup as well. Nonetheless overall I think the patient tolerated this quite well she did not have any significant pain which is great news. With regard to the wound in general I am hopeful that we will be able to get this healed fairly rapidly. Electronic Signature(s) Signed: 07/17/2019 3:29:20 PM By: Worthy Keeler PA-[name] Entered By: Worthy [name] on 07/17/2019 15:29:20 [name], Shaindy CMarland Kitchen ([id number]) -------------------------------------------------------------------------------- Physician Orders Details Patient Name: [name], [name]. Date of Service: 07/17/2019 2:15 PM Medical Record Number: [id number] Patient Account Number: [id number] Date of Birth/Sex: October 24, 1945 (74 y.o. F) Treating RN: [name] Primary Care Provider: Domenick [name] Other Clinician: Referring Provider: Claud [name] Treating Provider/Extender: [name], Tylea Hise Weeks in Treatment: 0 Verbal / Phone Orders: No Diagnosis Coding ICD-10 Coding Code Description S81.801A Unspecified open wound, right lower leg, initial encounter E11.622 Type 2 diabetes mellitus with other skin ulcer I10 Essential (primary) hypertension Wound Cleansing Wound #1 Right,Anterior Lower Leg o Clean wound with Normal Saline. Skin Barriers/Peri-Wound Care o Triamcinolone Acetonide Ointment (TCA) - to Peri-wound Primary Wound Dressing Wound #1 Right,Anterior Lower Leg o Silver Collagen o Iodoform packing Gauze Secondary Dressing Wound #1  Right,Anterior Lower Leg o ABD and Kerlix/Conform Dressing Change Frequency Wound #1 Right,Anterior Lower Leg o Other: - twice weekly Follow-up Appointments o Return Appointment in 1 week. o Nurse Visit as needed Edema Control Wound #1 Right,Anterior Lower Leg o Elevate legs to the level of the heart and pump ankles as often as possible o Other: - TubiGrip F Electronic Signature(s) Signed: 07/17/2019 3:24:15 PM By: [name] Signed: 07/17/2019 4:02:49 PM By: Worthy Keeler PA-[name] Entered By: [name] on 07/17/2019 14:57:58 Grahn, Betsabe [name]. ([id number]) -------------------------------------------------------------------------------- Problem List Details Patient Name: [name], [name]. Date of Service: 07/17/2019 2:15 PM Medical Record Number: [id number] Patient Account Number: [id number] Date of Birth/Sex: 1946-02-22 (74 y.o. F) Treating RN: [name] Primary Care Provider: Domenick [name] Other Clinician: Referring Provider: Claud [name] Treating Provider/Extender: [name], Ellysa Parrack Weeks in Treatment: 0 Active Problems ICD-10 Encounter Code Description Active Date MDM Diagnosis S81.801A Unspecified open wound, right lower leg, initial encounter 07/17/2019 No Yes E11.622 Type 2 diabetes mellitus with other skin ulcer 07/17/2019 No Yes I10 Essential (primary) hypertension 07/17/2019 No Yes Inactive Problems Resolved Problems Electronic Signature(s) Signed: 07/17/2019 2:42:22 PM By: Worthy Keeler PA-[name] Entered By: Worthy [name] on 07/17/2019 14:42:21 Gurry, Lasharon [name]. ([id number]) -------------------------------------------------------------------------------- Progress Note Details Patient Name: [name], [name]. Date of Service: 07/17/2019 2:15 PM Medical Record Number: [id number] Patient Account Number: [id number] Date of Birth/Sex: 17-Jul-1945 (74 y.o. F) Treating RN: [name] Primary Care Provider: Domenick [name] Other Clinician: Referring Provider:  Claud [name] Treating Provider/Extender: [name], Jessamine Barcia Weeks in Treatment: 0 Subjective Chief Complaint Information obtained from Patient Right LE Ulcer secondary to trauma History of Present Illness (HPI) 07/17/2019 upon evaluation today patient presents for initial evaluation here in our clinic concerning issues with her right lower extremity. She subsequently sustained an injury on April 10 when she dropped something onto her leg causing a skin tear/hematoma. She was seen in urgent care where this was sutured and then subsequently the sutures are still in place. She was also given doxycycline about 10 days ago but fortunately seems to be doing a lot better in  that regard as far as any infection is concerned I think the doxycycline is done well. There does not appear to be any signs of active infection which is good news. No fevers, chills, nausea, vomiting, or diarrhea. The patient does have a history of diabetes and high blood pressure but otherwise is fairly healthy. She does have some lower extremity edema and she also does have psoriasis. Patient History Allergies penicillin Family History Diabetes - Siblings,Maternal Grandparents, Hypertension - Mother, Thyroid Problems - Siblings, No family history of Cancer, Heart Disease, Hereditary Spherocytosis, Kidney Disease, Lung Disease, Seizures, Stroke, Tuberculosis. Social History Never smoker, Marital Status - Widowed, Alcohol Use - Rarely, Drug Use - No History, Caffeine Use - Daily. Medical History Eyes Denies history of Cataracts, Glaucoma, Optic Neuritis Hematologic/Lymphatic Denies history of Anemia, Hemophilia, Human Immunodeficiency Virus, Lymphedema, Sickle Cell Disease Respiratory Denies history of Aspiration, Asthma, Chronic Obstructive Pulmonary Disease (COPD), Pneumothorax, Sleep Apnea, Tuberculosis Cardiovascular Patient has history of Hypertension Denies history of Angina, Arrhythmia, Congestive Heart Failure,  Coronary Artery Disease, Deep Vein Thrombosis, Hypotension, Myocardial Infarction, Peripheral Arterial Disease, Peripheral Venous Disease, Phlebitis, Vasculitis Gastrointestinal Denies history of Cirrhosis , Colitis, Crohn s, Hepatitis A, Hepatitis B, Hepatitis [name] Endocrine Patient has history of Type II Diabetes Genitourinary Denies history of End Stage Renal Disease Immunological Denies history of Lupus Erythematosus, Raynaud s, Scleroderma Integumentary (Skin) Denies history of History of Burn, History of pressure wounds Musculoskeletal Denies history of Gout, Rheumatoid Arthritis, Osteoarthritis, Osteomyelitis Neurologic Denies history of Dementia, Neuropathy, Quadriplegia, Paraplegia, Seizure Disorder Oncologic Denies history of Received Chemotherapy, Received Radiation Psychiatric Denies history of Anorexia/bulimia, Confinement Anxiety Patient is treated with Controlled Diet. Review of Systems (ROS) Constitutional Symptoms (General Health) Denies complaints or symptoms of Fatigue, Fever, Chills, Marked Weight Change. Eyes Complains or has symptoms of Glasses / Contacts - glasses. Denies complaints or symptoms of Dry Eyes, Vision Changes. Haynes, Starr ([id number]) Ear/Nose/Mouth/Throat Denies complaints or symptoms of Difficult clearing ears, Sinusitis. Hematologic/Lymphatic Denies complaints or symptoms of Bleeding / Clotting Disorders, Human Immunodeficiency Virus. Respiratory Denies complaints or symptoms of Chronic or frequent coughs, Shortness of Breath. Cardiovascular Denies complaints or symptoms of Chest pain, LE edema. Gastrointestinal Denies complaints or symptoms of Frequent diarrhea, Nausea, Vomiting. Endocrine Complains or has symptoms of Thyroid disease. Denies complaints or symptoms of Hepatitis, Polydypsia (Excessive Thirst). Genitourinary Denies complaints or symptoms of Kidney failure/ Dialysis, Incontinence/dribbling. Immunological Denies  complaints or symptoms of Hives, Itching. Integumentary (Skin) Denies complaints or symptoms of Wounds, Bleeding or bruising tendency, Breakdown, Swelling. Musculoskeletal Denies complaints or symptoms of Muscle Pain, Muscle Weakness. Neurologic Denies complaints or symptoms of Numbness/parasthesias, Focal/Weakness. Oncologic melanoma Psychiatric Denies complaints or symptoms of Anxiety, Claustrophobia. Objective Constitutional patient is hypertensive.. pulse regular and within target range for patient.Marland Kitchen respirations regular, non-labored and within target range for patient.Marland Kitchen temperature within target range for patient.. Well-nourished and well-hydrated in no acute distress. Vitals Time Taken: 2:12 PM, Height: 59 in, Source: Stated, Weight: 178 lbs, Source: Measured, BMI: 35.9, Temperature: 98.6 F, Pulse: 65 bpm, Respiratory Rate: 16 breaths/min, Blood Pressure: 144/52 mmHg. Eyes conjunctiva clear no eyelid edema noted. pupils equal round and reactive to light and accommodation. Ears, Nose, Mouth, and Throat no gross abnormality of ear auricles or external auditory canals. normal hearing noted during conversation. mucus membranes moist. Respiratory normal breathing without difficulty. Cardiovascular 2+ dorsalis pedis/posterior tibialis pulses. no clubbing, cyanosis, significant edema, Musculoskeletal normal gait and posture. no significant deformity or arthritic changes, no loss or range of motion, no  clubbing. Psychiatric this patient is able to make decisions and demonstrates good insight into disease process. Alert and Oriented x 3. pleasant and cooperative. General Notes: Upon inspection patient's wound actually did require removal of the 3 sutures in place. This did open up an area where in one spot there really was not significant depth I think little bit of collagen here will help that still up. In the other area she actually has a much deeper hematoma which is good to require  some management here. I was able to use a sterile Q-tip and clean out a lot in fact I believe all of the hematoma from underneath this region. Subsequently I did pack this with iodoform gauze dressing which also think is going to be of benefit for the patient as far as working out the drainage and preventing infection from building up due to fluid buildup as well. Nonetheless overall I think the patient tolerated this quite well she did not have any significant pain which is great news. With regard to the wound in general I am hopeful that we will be able to get this healed fairly rapidly. Integumentary (Hair, Skin) Wound #1 status is Open. Original cause of wound was Trauma. The wound is located on the Right,Anterior Lower Leg. The wound measures 5.2cm length x 3.2cm width x 0.1cm depth; 13.069cm^2 area and 1.307cm^3 volume. There is Fat Layer (Subcutaneous Tissue) Exposed exposed. There is no tunneling or undermining noted. There is a medium amount of serosanguineous drainage noted. The wound margin is flat and intact. There is medium (34-66%) red granulation within the wound bed. There is a medium (34-66%) amount of necrotic tissue within the [place]. ([id number]) wound bed including Eschar and Adherent Slough. Assessment Active Problems ICD-10 Unspecified open wound, right lower leg, initial encounter Type 2 diabetes mellitus with other skin ulcer Essential (primary) hypertension Plan Wound Cleansing: Wound #1 Right,Anterior Lower Leg: Clean wound with Normal Saline. Skin Barriers/Peri-Wound Care: Triamcinolone Acetonide Ointment (TCA) - to Peri-wound Primary Wound Dressing: Wound #1 Right,Anterior Lower Leg: Silver Collagen Iodoform packing Gauze Secondary Dressing: Wound #1 Right,Anterior Lower Leg: ABD and Kerlix/Conform Dressing Change Frequency: Wound #1 Right,Anterior Lower Leg: Other: - twice weekly Follow-up Appointments: Return Appointment in 1 week. Nurse  Visit as needed Edema Control: Wound #1 Right,Anterior Lower Leg: Elevate legs to the level of the heart and pump ankles as often as possible Other: - TubiGrip F 1 my suggestion at this time is good to be that we go ahead and initiate treatment with a silver collagen dressing to the small distal portion of the wound where there is not much depth. 2. I did pack iodoform packing gauze into the region of hematoma once this was evacuated in the upper portion of the wound. She tolerated that without pain or complication. 3. I am to recommend that we go ahead and use Tubigrip for the patient after securing this with a ABD pad and roll gauze over top of the wound bed. We will see patient back for reevaluation in 1 week here in the clinic. If anything worsens or changes patient will contact our office for additional recommendations. Electronic Signature(s) Signed: 07/17/2019 3:31:03 PM By: Worthy Keeler PA-[name] Entered By: Worthy [name] on 07/17/2019 15:31:02 Jakubowicz, Knox [name]. ([id number]) -------------------------------------------------------------------------------- ROS/PFSH Details Patient Name: [name], [name]. Date of Service: 07/17/2019 2:15 PM Medical Record Number: [id number] Patient Account Number: [id number] Date of Birth/Sex: [birth year] (74 y.o. F) Treating RN: Army Melia Primary Care Provider: Osborne Casco,  RICHARD Other Clinician: Referring Provider: Claud [name] Treating Provider/Extender: STONE III, Ingvald Theisen Weeks in Treatment: 0 Constitutional Symptoms (General Health) Complaints and Symptoms: Negative for: Fatigue; Fever; Chills; Marked Weight Change Eyes Complaints and Symptoms: Positive for: Glasses / Contacts - glasses Negative for: Dry Eyes; Vision Changes Medical History: Negative for: Cataracts; Glaucoma; Optic Neuritis Ear/Nose/Mouth/Throat Complaints and Symptoms: Negative for: Difficult clearing ears; Sinusitis Hematologic/Lymphatic Complaints and Symptoms: Negative  for: Bleeding / Clotting Disorders; Human Immunodeficiency Virus Medical History: Negative for: Anemia; Hemophilia; Human Immunodeficiency Virus; Lymphedema; Sickle Cell Disease Respiratory Complaints and Symptoms: Negative for: Chronic or frequent coughs; Shortness of Breath Medical History: Negative for: Aspiration; Asthma; Chronic Obstructive Pulmonary Disease (COPD); Pneumothorax; Sleep Apnea; Tuberculosis Cardiovascular Complaints and Symptoms: Negative for: Chest pain; LE edema Medical History: Positive for: Hypertension Negative for: Angina; Arrhythmia; Congestive Heart Failure; Coronary Artery Disease; Deep Vein Thrombosis; Hypotension; Myocardial Infarction; Peripheral Arterial Disease; Peripheral Venous Disease; Phlebitis; Vasculitis Gastrointestinal Complaints and Symptoms: Negative for: Frequent diarrhea; Nausea; Vomiting Medical History: Negative for: Cirrhosis ; Colitis; Crohnos; Hepatitis A; Hepatitis B; Hepatitis [name] Endocrine Complaints and Symptoms: Positive for: Thyroid disease Negative for: Hepatitis; Polydypsia (Excessive Thirst) Medical History: Positive for: Type II Diabetes [name], [name]. ([id number]) Time with diabetes: 2016 Treated with: Diet Genitourinary Complaints and Symptoms: Negative for: Kidney failure/ Dialysis; Incontinence/dribbling Medical History: Negative for: End Stage Renal Disease Immunological Complaints and Symptoms: Negative for: Hives; Itching Medical History: Negative for: Lupus Erythematosus; Raynaudos; Scleroderma Integumentary (Skin) Complaints and Symptoms: Negative for: Wounds; Bleeding or bruising tendency; Breakdown; Swelling Medical History: Negative for: History of Burn; History of pressure wounds Musculoskeletal Complaints and Symptoms: Negative for: Muscle Pain; Muscle Weakness Medical History: Negative for: Gout; Rheumatoid Arthritis; Osteoarthritis; Osteomyelitis Neurologic Complaints and Symptoms: Negative  for: Numbness/parasthesias; Focal/Weakness Medical History: Negative for: Dementia; Neuropathy; Quadriplegia; Paraplegia; Seizure Disorder Psychiatric Complaints and Symptoms: Negative for: Anxiety; Claustrophobia Medical History: Negative for: Anorexia/bulimia; Confinement Anxiety Oncologic Complaints and Symptoms: Review of System Notes: melanoma Medical History: Negative for: Received Chemotherapy; Received Radiation Immunizations Pneumococcal Vaccine: Received Pneumococcal Vaccination: Yes Implantable Devices None Family and Social History [name], [name]. ([id number]) Cancer: No; Diabetes: Yes - Siblings,Maternal Grandparents; Heart Disease: No; Hereditary Spherocytosis: No; Hypertension: Yes - Mother; Kidney Disease: No; Lung Disease: No; Seizures: No; Stroke: No; Thyroid Problems: Yes - Siblings; Tuberculosis: No; Never smoker; Marital Status - Widowed; Alcohol Use: Rarely; Drug Use: No History; Caffeine Use: Daily; Financial Concerns: No; Food, Clothing or Shelter Needs: No; Support System Lacking: No; Transportation Concerns: No Electronic Signature(s) Signed: 07/17/2019 4:02:49 PM By: Worthy Keeler PA-[name] Signed: 07/20/2019 9:21:34 AM By: Army Melia Entered By: Army Melia on 07/17/2019 14:29:42 [name], [name]. ([id number]) -------------------------------------------------------------------------------- SuperBill Details Patient Name: [name], Arianne [name]. Date of Service: 07/17/2019 Medical Record Number: [id number] Patient Account Number: [id number] Date of Birth/Sex: 1945/04/25 (74 y.o. F) Treating RN: [name] Primary Care Provider: Domenick [name] Other Clinician: Referring Provider: Claud [name] Treating Provider/Extender: [name], Loida Calamia Weeks in Treatment: 0 Diagnosis Coding ICD-10 Codes Code Description S81.801A Unspecified open wound, right lower leg, initial encounter E11.622 Type 2 diabetes mellitus with other skin ulcer I10 Essential (primary)  hypertension Facility Procedures CPT4 Code: [id number] Description: 99214 - WOUND CARE VISIT-LEV 4 EST PT Modifier: Quantity: 1 Physician Procedures CPT4 Code: [id number] Description: [id number] - WC PHYS LEVEL 4 - NEW PT Modifier: Quantity: 1 CPT4 Code: Description: ICD-10 Diagnosis Description S81.801A Unspecified open wound, right lower leg, initial encounter E11.622 Type 2 diabetes mellitus with other skin ulcer I10 Essential (primary)  hypertension Modifier: Quantity: Electronic Signature(s) Signed: 07/17/2019 3:33:21 PM By: Worthy Keeler PA-[name] Entered By: Worthy [name] on 07/17/2019 15:33:20

## 2019-07-20 NOTE — Progress Notes (Signed)
ADDYLINE, GENNUSA ([id number]) Visit Report for 07/20/2019 Arrival Information Details Patient Name: [name], [name] Date of Service: 07/20/2019 10:45 AM Medical Record Number: [id number] Patient Account Number: [id number] Date of Birth/Sex: [birth year] (74 y.o. F) Treating RN: Montey Hora Primary Care Toinette Lackie: Domenick Gong Other Clinician: Referring Justus Droke: Domenick Gong Treating Tanecia Mccay/Extender: Melburn Hake, HOYT Weeks in Treatment: 0 Visit Information History Since Last Visit Added or deleted any medications: No Patient Arrived: Ambulatory Any new allergies or adverse reactions: No Arrival Time: 10:54 Had a fall or experienced change in No Accompanied By: self activities of daily living that may affect Transfer Assistance: None risk of falls: Patient Identification Verified: Yes Signs or symptoms of abuse/neglect since last visito No Secondary Verification Process Completed: Yes Hospitalized since last visit: No Patient Has Alerts: Yes Implantable device outside of the clinic excluding No Patient Alerts: DMII cellular tissue based products placed in the center since last visit: Has Dressing in Place as Prescribed: Yes Has Compression in Place as Prescribed: Yes Pain Present Now: No Electronic Signature(s) Signed: 07/20/2019 4:26:48 PM By: Montey Hora Entered By: Montey Hora on 07/20/2019 10:54:25 Diagonal, [place]ahului[place] Kitchen ([id number]) -------------------------------------------------------------------------------- Clinic Level of Care Assessment Details Patient Name: [name], [name] Date of Service: 07/20/2019 10:45 AM Medical Record Number: [id number] Patient Account Number: [id number] Date of Birth/Sex: 06-04-1945 (74 y.o. F) Treating RN: Montey Hora Primary Care Amyra Vantuyl: Domenick Gong Other Clinician: Referring Tehya Leath: Domenick Gong Treating Emin Foree/Extender: Melburn Hake, HOYT Weeks in Treatment: 0 Clinic Level of Care Assessment Items TOOL 4  Quantity Score []  - Use when only an EandM is performed on FOLLOW-UP visit 0 ASSESSMENTS - Nursing Assessment / Reassessment X - Reassessment of Co-morbidities (includes updates in patient status) 1 10 X- 1 5 Reassessment of Adherence to Treatment Plan ASSESSMENTS - Wound and Skin Assessment / Reassessment X - Simple Wound Assessment / Reassessment - one wound 1 5 []  - 0 Complex Wound Assessment / Reassessment - multiple wounds []  - 0 Dermatologic / Skin Assessment (not related to wound area) ASSESSMENTS - Focused Assessment []  - Circumferential Edema Measurements - multi extremities 0 []  - 0 Nutritional Assessment / Counseling / Intervention []  - 0 Lower Extremity Assessment (monofilament, tuning fork, pulses) []  - 0 Peripheral Arterial Disease Assessment (using hand held doppler) ASSESSMENTS - Ostomy and/or Continence Assessment and Care []  - Incontinence Assessment and Management 0 []  - 0 Ostomy Care Assessment and Management (repouching, etc.) PROCESS - Coordination of Care X - Simple Patient / Family Education for ongoing care 1 15 []  - 0 Complex (extensive) Patient / Family Education for ongoing care X- 1 10 Staff obtains Programmer, systems, Records, Test Results / Process Orders []  - 0 Staff telephones HHA, Nursing Homes / Clarify orders / etc []  - 0 Routine Transfer to another Facility (non-emergent condition) []  - 0 Routine Hospital Admission (non-emergent condition) []  - 0 New Admissions / Biomedical engineer / Ordering NPWT, Apligraf, etc. []  - 0 Emergency Hospital Admission (emergent condition) X- 1 10 Simple Discharge Coordination []  - 0 Complex (extensive) Discharge Coordination PROCESS - Special Needs []  - Pediatric / Minor Patient Management 0 []  - 0 Isolation Patient Management []  - 0 Hearing / Language / Visual special needs []  - 0 Assessment of Community assistance (transportation, D/C planning, etc.) []  - 0 Additional assistance / Altered  mentation []  - 0 Support Surface(s) Assessment (bed, cushion, seat, etc.) INTERVENTIONS - Wound Cleansing / Measurement [name], [name] ([id number]) X- 1 5 Simple Wound Cleansing - one wound []  -  0 Complex Wound Cleansing - multiple wounds []  - 0 Wound Imaging (photographs - any number of wounds) []  - 0 Wound Tracing (instead of photographs) X- 1 5 Simple Wound Measurement - one wound []  - 0 Complex Wound Measurement - multiple wounds INTERVENTIONS - Wound Dressings []  - Small Wound Dressing one or multiple wounds 0 X- 1 15 Medium Wound Dressing one or multiple wounds []  - 0 Large Wound Dressing one or multiple wounds []  - 0 Application of Medications - topical []  - 0 Application of Medications - injection INTERVENTIONS - Miscellaneous []  - External ear exam 0 []  - 0 Specimen Collection (cultures, biopsies, blood, body fluids, etc.) []  - 0 Specimen(s) / Culture(s) sent or taken to Lab for analysis []  - 0 Patient Transfer (multiple staff / Civil Service fast streamer / Similar devices) []  - 0 Simple Staple / Suture removal (25 or less) []  - 0 Complex Staple / Suture removal (26 or more) []  - 0 Hypo / Hyperglycemic Management (close monitor of Blood Glucose) []  - 0 Ankle / Brachial Index (ABI) - do not check if billed separately []  - 0 Vital Signs Has the patient been seen at the hospital within the last three years: Yes Total Score: 80 Level Of Care: New/Established - Level 3 Electronic Signature(s) Signed: 07/20/2019 4:26:48 PM By: Montey Hora Entered By: Montey Hora on 07/20/2019 11:25:08 [name] -------------------------------------------------------------------------------- Encounter Discharge Information Details Patient Name: [name], [name] Date of Service: 07/20/2019 10:45 AM Medical Record Number: [id number] Patient Account Number: [id number] Date of Birth/Sex: [birth year] (74 y.o. F) Treating RN: Montey Hora Primary Care Cayce Quezada:  Domenick Gong Other Clinician: Referring Astryd Pearcy: Domenick Gong Treating Mandalyn Pasqua/Extender: Melburn Hake, HOYT Weeks in Treatment: 0 Encounter Discharge Information Items Discharge Condition: Stable Ambulatory Status: Ambulatory Discharge Destination: Home Transportation: Private Auto Accompanied By: self Schedule Follow-up Appointment: Yes Clinical Summary of Care: Electronic Signature(s) Signed: 07/20/2019 11:24:40 AM By: Montey Hora Entered By: Montey Hora on 07/20/2019 11:24:39 Afshar, [name] ([id number]) -------------------------------------------------------------------------------- Wound Assessment Details Patient Name: Golphin, [name] Date of Service: 07/20/2019 10:45 AM Medical Record Number: [id number] Patient Account Number: [id number] Date of Birth/Sex: 30-May-1945 (74 y.o. F) Treating RN: Montey Hora Primary Care Brynn Mulgrew: Domenick Gong Other Clinician: Referring Severn Goddard: Domenick Gong Treating Falicity Sheets/Extender: Melburn Hake, HOYT Weeks in Treatment: 0 Wound Status Wound Number: 1 Primary Etiology: Trauma, Other Wound Location: Right, Anterior Lower Leg Wound Status: Open Wounding Event: Trauma Comorbid History: Hypertension, Type II Diabetes Date Acquired: 06/20/2019 Weeks Of Treatment: 0 Clustered Wound: No Wound Measurements Length: (cm) 5.2 Width: (cm) 3.2 Depth: (cm) 0.4 Area: (cm) 13.069 Volume: (cm) 5.228 % Reduction in Area: 0% % Reduction in Volume: -300% Epithelialization: None Tunneling: No Wound Description Classification: Full Thickness Without Exposed Support Structu Wound Margin: Flat and Intact Exudate Amount: Medium Exudate Type: Serosanguineous Exudate Color: red, brown res Foul Odor After Cleansing: No Slough/Fibrino Yes Wound Bed Granulation Amount: Medium (34-66%) Exposed Structure Granulation Quality: Red Fascia Exposed: No Necrotic Amount: Medium (34-66%) Fat Layer (Subcutaneous Tissue) Exposed:  Yes Necrotic Quality: Eschar, Adherent Slough Tendon Exposed: No Muscle Exposed: No Joint Exposed: No Bone Exposed: No Treatment Notes Wound #1 (Right, Anterior Lower Leg) Notes iodoform, silver collaggen, abd, conform and tubigrip f Electronic Signature(s) Signed: 07/20/2019 11:23:46 AM By: Montey Hora Entered By: Montey Hora on 07/20/2019 11:23:45

## 2019-07-20 NOTE — Progress Notes (Signed)
MEELAH, STEGALL ([id number]) Visit Report for 07/17/2019 Abuse/Suicide Risk Screen Details Patient Name: [name], [name] Date of Service: 07/17/2019 2:15 PM Medical Record Number: [id number] Patient Account Number: [id number] Date of Birth/Sex: 1945-06-20 (74 y.o. F) Treating RN: Army Melia Primary Care Tosca Pletz: Domenick Gong Other Clinician: Referring Alga Southall: Claud Kelp Treating Alexandria Current/Extender: STONE III, HOYT Weeks in Treatment: 0 Abuse/Suicide Risk Screen Items Answer ABUSE RISK SCREEN: Has anyone close to you tried to hurt or harm you recentlyo No Do you feel uncomfortable with anyone in your familyo No Has anyone forced you do things that you didnot want to doo No Electronic Signature(s) Signed: 07/20/2019 9:21:34 AM By: Army Melia Entered By: Army Melia on 07/17/2019 14:29:50 [name], [name] ([id number]) -------------------------------------------------------------------------------- Activities of Daily Living Details Patient Name: [name], [name] Date of Service: 07/17/2019 2:15 PM Medical Record Number: [id number] Patient Account Number: [id number] Date of Birth/Sex: [birth year] (74 y.o. F) Treating RN: Army Melia Primary Care Doreather Hoxworth: Domenick Gong Other Clinician: Referring Brenly Trawick: Claud Kelp Treating Adrin Julian/Extender: STONE III, HOYT Weeks in Treatment: 0 Activities of Daily Living Items Answer Activities of Daily Living (Please select one for each item) Drive Automobile Completely Able Take Medications Completely Able Use Telephone Completely Able Care for Appearance Completely Able Use Toilet Completely Able Bath / Shower Completely Able Dress Self Completely Able Feed Self Completely Able Walk Completely Able Get In / Out Bed Completely Able Housework Completely Able Prepare Meals Completely Able Handle Money Completely Able Shop for Self Completely Able Electronic Signature(s) Signed: 07/20/2019 9:21:34 AM By: Army Melia Entered By: Army Melia on 07/17/2019 14:29:58 [name], [name] ([id number]) -------------------------------------------------------------------------------- Education Screening Details Patient Name: [name], [name] Date of Service: 07/17/2019 2:15 PM Medical Record Number: [id number] Patient Account Number: [id number] Date of Birth/Sex: 1946/01/31 (74 y.o. F) Treating RN: Army Melia Primary Care Arlett Goold: Domenick Gong Other Clinician: Referring Lum Stillinger: Claud Kelp Treating [name]/Extender: Melburn Hake, HOYT Weeks in Treatment: 0 Primary Learner Assessed: Patient Learning Preferences/Education Level/Primary Language Learning Preference: Explanation Highest Education Level: High School Preferred Language: English Cognitive Barrier Language Barrier: No Translator Needed: No Memory Deficit: No Emotional Barrier: No Cultural/Religious Beliefs Affecting Medical Care: No Physical Barrier Impaired Vision: No Impaired Hearing: No Decreased Hand dexterity: No Knowledge/Comprehension Knowledge Level: High Comprehension Level: High Ability to understand written instructions: High Ability to understand verbal instructions: High Motivation Anxiety Level: Calm Cooperation: Cooperative Education Importance: Acknowledges Need Interest in Health Problems: Asks Questions Perception: Coherent Willingness to Engage in Self-Management High Activities: Readiness to Engage in Self-Management High Activities: Electronic Signature(s) Signed: 07/20/2019 9:21:34 AM By: Army Melia Entered By: Army Melia on 07/17/2019 14:30:13 [name], [name] ([id number]) -------------------------------------------------------------------------------- Fall Risk Assessment Details Patient Name: [name], [name] Date of Service: 07/17/2019 2:15 PM Medical Record Number: [id number] Patient Account Number: [id number] Date of Birth/Sex: 05-04-1945 (74 y.o. F) Treating RN: Army Melia Primary  Care Furqan Gosselin: Domenick Gong Other Clinician: Referring Jermell Holeman: Claud Kelp Treating Lyana Asbill/Extender: Melburn Hake, HOYT Weeks in Treatment: 0 Fall Risk Assessment Items Have you had 2 or more falls in the last 12 monthso 0 No Have you had any fall that resulted in injury in the last 12 monthso 0 No FALLS RISK SCREEN History of falling - immediate or within 3 months 0 No Secondary diagnosis (Do you have 2 or more medical diagnoseso) 0 No Ambulatory aid None/bed rest/wheelchair/nurse 0 No Crutches/cane/walker 0 No Furniture 0 No Intravenous therapy Access/Saline/Heparin Lock 0 No Gait/Transferring Normal/ bed rest/ wheelchair 0 No Weak (short steps with  or without shuffle, stooped but able to lift head while walking, may 0 No seek support from furniture) Impaired (short steps with shuffle, may have difficulty arising from chair, head down, impaired 0 No balance) Mental Status Oriented to own ability 0 No Electronic Signature(s) Signed: 07/20/2019 9:21:34 AM By: Army Melia Entered By: Army Melia on 07/17/2019 14:30:17 [name], [name] ([id number]) -------------------------------------------------------------------------------- Foot Assessment Details Patient Name: [name], [name] Date of Service: 07/17/2019 2:15 PM Medical Record Number: [id number] Patient Account Number: [id number] Date of Birth/Sex: [birth year] (74 y.o. F) Treating RN: Army Melia Primary Care Jalisia Puchalski: Domenick Gong Other Clinician: Referring Racine Erby: Claud Kelp Treating Cambryn Charters/Extender: STONE III, HOYT Weeks in Treatment: 0 Foot Assessment Items Site Locations + = Sensation present, - = Sensation absent, C = Callus, U = Ulcer R = Redness, W = Warmth, M = Maceration, PU = Pre-ulcerative lesion F = Fissure, S = Swelling, D = Dryness Assessment Right: Left: Other Deformity: No No Prior Foot Ulcer: No No Prior Amputation: No No Charcot Joint: No No Ambulatory Status: Ambulatory Without  Help Gait: Steady Electronic Signature(s) Signed: 07/20/2019 9:21:34 AM By: Army Melia Entered By: Army Melia on 07/17/2019 14:30:40 [name], [name] ([id number]) -------------------------------------------------------------------------------- Nutrition Risk Screening Details Patient Name: [name], [name] Date of Service: 07/17/2019 2:15 PM Medical Record Number: [id number] Patient Account Number: [id number] Date of Birth/Sex: 1945/09/03 (74 y.o. F) Treating RN: Army Melia Primary Care Tzivia Oneil: Domenick Gong Other Clinician: Referring Llewellyn Schoenberger: Claud Kelp Treating Jarren Para/Extender: STONE III, HOYT Weeks in Treatment: 0 Height (in): 59 Weight (lbs): 178 Body Mass Index (BMI): 35.9 Nutrition Risk Screening Items Score Screening NUTRITION RISK SCREEN: I have an illness or condition that made me change the kind and/or amount of food I eat 0 No I eat fewer than two meals per day 0 No I eat few fruits and vegetables, or milk products 0 No I have three or more drinks of beer, liquor or wine almost every day 0 No I have tooth or mouth problems that make it hard for me to eat 0 No I don't always have enough money to buy the food I need 0 No I eat alone most of the time 0 No I take three or more different prescribed or over-the-counter drugs a day 0 No Without wanting to, I have lost or gained 10 pounds in the last six months 0 No I am not always physically able to shop, cook and/or feed myself 0 No Nutrition Protocols Good Risk Protocol 0 No interventions needed Moderate Risk Protocol High Risk Proctocol Risk Level: Good Risk Score: 0 Electronic Signature(s) Signed: 07/20/2019 9:21:34 AM By: Army Melia Entered By: Army Melia on 07/17/2019 14:30:23

## 2019-07-21 ENCOUNTER — Ambulatory Visit: Payer: Medicare Other | Admitting: Physician Assistant

## 2019-07-21 DIAGNOSIS — F321 Major depressive disorder, single episode, moderate: Secondary | ICD-10-CM | POA: Diagnosis not present

## 2019-07-21 DIAGNOSIS — E1149 Type 2 diabetes mellitus with other diabetic neurological complication: Secondary | ICD-10-CM | POA: Diagnosis not present

## 2019-07-21 DIAGNOSIS — Z0000 Encounter for general adult medical examination without abnormal findings: Secondary | ICD-10-CM | POA: Diagnosis not present

## 2019-07-21 DIAGNOSIS — D8989 Other specified disorders involving the immune mechanism, not elsewhere classified: Secondary | ICD-10-CM | POA: Diagnosis not present

## 2019-07-21 DIAGNOSIS — E039 Hypothyroidism, unspecified: Secondary | ICD-10-CM | POA: Diagnosis not present

## 2019-07-21 DIAGNOSIS — I10 Essential (primary) hypertension: Secondary | ICD-10-CM | POA: Diagnosis not present

## 2019-07-21 DIAGNOSIS — R202 Paresthesia of skin: Secondary | ICD-10-CM | POA: Diagnosis not present

## 2019-07-21 DIAGNOSIS — J302 Other seasonal allergic rhinitis: Secondary | ICD-10-CM | POA: Diagnosis not present

## 2019-07-21 DIAGNOSIS — E663 Overweight: Secondary | ICD-10-CM | POA: Diagnosis not present

## 2019-07-21 DIAGNOSIS — L409 Psoriasis, unspecified: Secondary | ICD-10-CM | POA: Diagnosis not present

## 2019-07-21 DIAGNOSIS — E7800 Pure hypercholesterolemia, unspecified: Secondary | ICD-10-CM | POA: Diagnosis not present

## 2019-07-21 DIAGNOSIS — Z1331 Encounter for screening for depression: Secondary | ICD-10-CM | POA: Diagnosis not present

## 2019-07-21 DIAGNOSIS — F411 Generalized anxiety disorder: Secondary | ICD-10-CM | POA: Diagnosis not present

## 2019-07-21 DIAGNOSIS — Z1339 Encounter for screening examination for other mental health and behavioral disorders: Secondary | ICD-10-CM | POA: Diagnosis not present

## 2019-07-23 ENCOUNTER — Encounter: Payer: Medicare Other | Admitting: Internal Medicine

## 2019-07-23 ENCOUNTER — Other Ambulatory Visit
Admission: RE | Admit: 2019-07-23 | Discharge: 2019-07-23 | Disposition: A | Discharge status: Home / Self Care | Payer: Medicare Other | Source: Ambulatory Visit | Attending: Internal Medicine | Admitting: Internal Medicine

## 2019-07-23 ENCOUNTER — Other Ambulatory Visit: Payer: Self-pay

## 2019-07-23 DIAGNOSIS — E1169 Type 2 diabetes mellitus with other specified complication: Secondary | ICD-10-CM | POA: Diagnosis not present

## 2019-07-23 DIAGNOSIS — L089 Local infection of the skin and subcutaneous tissue, unspecified: Secondary | ICD-10-CM | POA: Diagnosis not present

## 2019-07-23 DIAGNOSIS — I10 Essential (primary) hypertension: Secondary | ICD-10-CM | POA: Diagnosis not present

## 2019-07-23 DIAGNOSIS — S81801A Unspecified open wound, right lower leg, initial encounter: Secondary | ICD-10-CM | POA: Diagnosis not present

## 2019-07-23 DIAGNOSIS — Z1212 Encounter for screening for malignant neoplasm of rectum: Secondary | ICD-10-CM | POA: Diagnosis not present

## 2019-07-23 DIAGNOSIS — E11622 Type 2 diabetes mellitus with other skin ulcer: Secondary | ICD-10-CM | POA: Diagnosis not present

## 2019-07-24 ENCOUNTER — Ambulatory Visit: Payer: Medicare Other | Admitting: Physician Assistant

## 2019-07-24 NOTE — Progress Notes (Signed)
YANCY, ZUBIETA ([id number]) Visit Report for 07/23/2019 HPI Details Patient Name: [name], [name] Date of Service: 07/23/2019 8:30 AM Medical Record Number: [id number] Patient Account Number: [id number] Date of Birth/Sex: 09-03-1945 (74 y.o. F) Treating RN: Army Melia Primary Care Provider: Domenick Gong Other Clinician: Referring Provider: Domenick Gong Treating Provider/Extender: Tito Dine in Treatment: 0 History of Present Illness HPI Description: 07/17/2019 upon evaluation today patient presents for initial evaluation here in our clinic concerning issues with her right lower extremity. She subsequently sustained an injury on April 10 when she dropped something onto her leg causing a skin tear/hematoma. She was seen in urgent care where this was sutured and then subsequently the sutures are still in place. She was also given doxycycline about 10 days ago but fortunately seems to be doing a lot better in that regard as far as any infection is concerned I think the doxycycline is done well. There does not appear to be any signs of active infection which is good news. No fevers, chills, nausea, vomiting, or diarrhea. The patient does have a history of diabetes and high blood pressure but otherwise is fairly healthy. She does have some lower extremity edema and she also does have psoriasis. 5/13; patient was in for a nurse visit on Monday. She now has 3 small open areas on the upper right tibial area. These do not interconnect however there is a wide amount of undermining from the larger open area medially. Serosanguineous drainage. She has only been in a Tubigrip I think this is because of the underlying psoriasis she needs to be able to lotion the skin on her legs. Electronic Signature(s) Signed: 07/23/2019 5:28:47 PM By: Linton Ham MD Entered By: Linton Ham on 07/23/2019 09:06:33 Fleetwood, [name]  ([id number]) -------------------------------------------------------------------------------- Physical Exam Details Patient Name: [name], [name] Date of Service: 07/23/2019 8:30 AM Medical Record Number: [id number] Patient Account Number: [id number] Date of Birth/Sex: [birth year] (74 y.o. F) Treating RN: Army Melia Primary Care Provider: Domenick Gong Other Clinician: Referring Provider: Domenick Gong Treating Provider/Extender: Tito Dine in Treatment: 0 Constitutional Patient is hypertensive.. Pulse regular and within target range for patient.Marland Kitchen Respirations regular, non-labored and within target range.. Temperature is normal and within the target range for the patient.Marland Kitchen appears in no distress. Respiratory Respiratory effort is easy and symmetric bilaterally. Rate is normal at rest and on room air.. Cardiovascular peal pulses palpable. Integumentary (Hair, Skin) Obvious scaling patches of psoriasis on her bilateral lower extremities.. Notes Wound exam; she now has 3 open spots in the previously described area. These do not interconnect however the upper area has a large amount of undermining laterally. Serosanguineous drainage from this. No palpable tenderness no erythema no crepitus. Electronic Signature(s) Signed: 07/23/2019 5:28:47 PM By: Linton Ham MD Entered By: Linton Ham on 07/23/2019 09:12:10 [name] -------------------------------------------------------------------------------- Physician Orders Details Patient Name: [name], [name] Date of Service: 07/23/2019 8:30 AM Medical Record Number: [id number] Patient Account Number: [id number] Date of Birth/Sex: 1945-09-24 (74 y.o. F) Treating RN: Army Melia Primary Care Provider: Domenick Gong Other Clinician: Referring Provider: Domenick Gong Treating Provider/Extender: Tito Dine in Treatment: 0 Verbal / Phone Orders: No Diagnosis Coding Wound  Cleansing Wound #1 Right,Anterior Lower Leg o Clean wound with Normal Saline. Skin Barriers/Peri-Wound Care o Triamcinolone Acetonide Ointment (TCA) - to Peri-wound Primary Wound Dressing Wound #1 Right,Anterior Lower Leg o Silver Alginate - on top o Iodoform packing Gauze Secondary Dressing Wound #1 Right,Anterior Lower Leg o ABD  and Kerlix/Conform Dressing Change Frequency Wound #1 Right,Anterior Lower Leg o Other: - twice weekly Follow-up Appointments o Return Appointment in 1 week. o Nurse Visit as needed - Monday 5/17 regular visit to see Dellia Nims next week Edema Control Wound #1 Right,Anterior Lower Leg o Elevate legs to the level of the heart and pump ankles as often as possible o Other: - TubiGrip F Laboratory o Bacteria identified in Wound by Culture (MICRO) - right leg oooo LOINC Code: [id number] oooo Convenience Name: Wound culture routine Electronic Signature(s) Signed: 07/23/2019 11:19:24 AM By: Army Melia Signed: 07/23/2019 5:28:47 PM By: Linton Ham MD Entered By: Army Melia on 07/23/2019 09:04:28 [name] -------------------------------------------------------------------------------- Problem List Details Patient Name: [name], [name] Date of Service: 07/23/2019 8:30 AM Medical Record Number: [id number] Patient Account Number: [id number] Date of Birth/Sex: 01/04/1946 (74 y.o. F) Treating RN: Army Melia Primary Care Provider: Domenick Gong Other Clinician: Referring Provider: Domenick Gong Treating Provider/Extender: Tito Dine in Treatment: 0 Active Problems ICD-10 Encounter Code Description Active Date MDM Diagnosis S81.801A Unspecified open wound, right lower leg, initial encounter 07/17/2019 No Yes E11.622 Type 2 diabetes mellitus with other skin ulcer 07/17/2019 No Yes I10 Essential (primary) hypertension 07/17/2019 No Yes Inactive Problems Resolved Problems Electronic  Signature(s) Signed: 07/23/2019 5:28:47 PM By: Linton Ham MD Entered By: Linton Ham on 07/23/2019 09:05:14 [name], [name] ([id number]) -------------------------------------------------------------------------------- Progress Note Details Patient Name: [name], [name] Date of Service: 07/23/2019 8:30 AM Medical Record Number: [id number] Patient Account Number: [id number] Date of Birth/Sex: 1945-06-17 (74 y.o. F) Treating RN: Army Melia Primary Care Provider: Domenick Gong Other Clinician: Referring Provider: Domenick Gong Treating Provider/Extender: Tito Dine in Treatment: 0 Subjective History of Present Illness (HPI) 07/17/2019 upon evaluation today patient presents for initial evaluation here in our clinic concerning issues with her right lower extremity. She subsequently sustained an injury on April 10 when she dropped something onto her leg causing a skin tear/hematoma. She was seen in urgent care where this was sutured and then subsequently the sutures are still in place. She was also given doxycycline about 10 days ago but fortunately seems to be doing a lot better in that regard as far as any infection is concerned I think the doxycycline is done well. There does not appear to be any signs of active infection which is good news. No fevers, chills, nausea, vomiting, or diarrhea. The patient does have a history of diabetes and high blood pressure but otherwise is fairly healthy. She does have some lower extremity edema and she also does have psoriasis. 5/13; patient was in for a nurse visit on Monday. She now has 3 small open areas on the upper right tibial area. These do not interconnect however there is a wide amount of undermining from the larger open area medially. Serosanguineous drainage. She has only been in a Tubigrip I think this is because of the underlying psoriasis she needs to be able to lotion the skin on her  legs. Objective Constitutional Patient is hypertensive.. Pulse regular and within target range for patient.Marland Kitchen Respirations regular, non-labored and within target range.. Temperature is normal and within the target range for the patient.Marland Kitchen appears in no distress. Vitals Time Taken: 8:36 AM, Height: 59 in, Weight: 178 lbs, BMI: 35.9, Temperature: 98.4 F, Pulse: 56 bpm, Respiratory Rate: 16 breaths/min, Blood Pressure: 145/56 mmHg. Respiratory Respiratory effort is easy and symmetric bilaterally. Rate is normal at rest and on room air.. General Notes: Wound exam; she now has  3 open spots in the previously described area. These do not interconnect however the upper area has a large amount of undermining laterally. Serosanguineous drainage from this. No palpable tenderness no erythema no crepitus. Integumentary (Hair, Skin) Obvious scaling patches of psoriasis on her bilateral lower extremities.. Wound #1 status is Open. Original cause of wound was Trauma. The wound is located on the Right,Anterior Lower Leg. The wound measures 2cm length x 1.5cm width x 0.5cm depth; 2.356cm^2 area and 1.178cm^3 volume. There is Fat Layer (Subcutaneous Tissue) Exposed exposed. There is no undermining noted, however, there is tunneling at 3:00 with a maximum distance of 2.5cm. There is a medium amount of serosanguineous drainage noted. The wound margin is flat and intact. There is medium (34-66%) red granulation within the wound bed. There is a medium (34-66%) amount of necrotic tissue within the wound bed including Eschar and Adherent Slough. Assessment Active Problems ICD-10 Unspecified open wound, right lower leg, initial encounter Type 2 diabetes mellitus with other skin ulcer Essential (primary) hypertension [name], [name] ([id number]) Plan Wound Cleansing: Wound #1 Right,Anterior Lower Leg: Clean wound with Normal Saline. Skin Barriers/Peri-Wound Care: Triamcinolone Acetonide Ointment (TCA) - to  Peri-wound Primary Wound Dressing: Wound #1 Right,Anterior Lower Leg: Silver Alginate - on top Iodoform packing Gauze Secondary Dressing: Wound #1 Right,Anterior Lower Leg: ABD and Kerlix/Conform Dressing Change Frequency: Wound #1 Right,Anterior Lower Leg: Other: - twice weekly Follow-up Appointments: Return Appointment in 1 week. Nurse Visit as needed - Monday 5/17 regular visit to see Dellia Nims next week Edema Control: Wound #1 Right,Anterior Lower Leg: Elevate legs to the level of the heart and pump ankles as often as possible Other: - TubiGrip F Laboratory ordered were: Wound culture routine - right leg 1. This is going to be a difficult area to close. She is using iodoform packing with silver collagen. I cannot really imagine packing any silver collagen into this. I simply cover the area with silver alginate to absorb any drainage. 2. I did a culture of the serosanguineous drainage. No empiric antibiotics 3. Ideally we would put more compression on this area however with her psoriasis she needs to be able to get at the surrounding skin in her leg therefore we will continue with the Tubigrip's. 4. She had a new small open area this week by description. As mentioned the open areas do not interconnect but she has wide undermining. This may be a difficult area to get to close Electronic Signature(s) Signed: 07/23/2019 5:28:47 PM By: Linton Ham MD Entered By: Linton Ham on 07/23/2019 09:10:20 [name], [name] CMarland Kitchen ([id number]) -------------------------------------------------------------------------------- SuperBill Details Patient Name: [name], Lumi C. Date of Service: 07/23/2019 Medical Record Number: [id number] Patient Account Number: [id number] Date of Birth/Sex: [birth year] (74 y.o. F) Treating RN: Army Melia Primary Care Provider: Domenick Gong Other Clinician: Referring Provider: Domenick Gong Treating Provider/Extender: Tito Dine in Treatment:  0 Diagnosis Coding ICD-10 Codes Code Description S81.801A Unspecified open wound, right lower leg, initial encounter E11.622 Type 2 diabetes mellitus with other skin ulcer I10 Essential (primary) hypertension Facility Procedures CPT4 Code: [id number] Description: 99213 - WOUND CARE VISIT-LEV 3 EST PT Modifier: Quantity: 1 Physician Procedures CPT4 Code: [id number] Description: 99213 - WC PHYS LEVEL 3 - EST PT Modifier: Quantity: 1 CPT4 Code: Description: ICD-10 Diagnosis Description S81.801A Unspecified open wound, right lower leg, initial encounter E11.622 Type 2 diabetes mellitus with other skin ulcer Modifier: Quantity: Electronic Signature(s) Signed: 07/23/2019 5:28:47 PM By: Linton Ham MD Entered By: Linton Ham on 07/23/2019 09:10:41

## 2019-07-24 NOTE — Progress Notes (Signed)
SIDRA, BRUE ([id number]) Visit Report for 07/23/2019 Arrival Information Details Patient Name: [name], [name] Date of Service: 07/23/2019 8:30 AM Medical Record Number: [id number] Patient Account Number: [id number] Date of Birth/Sex: [birth year] (74 y.o. F) Treating RN: Montey Hora Primary Care Jansen Sciuto: Domenick Gong Other Clinician: Referring Aquan Kope: Domenick Gong Treating Tip Atienza/Extender: Tito Dine in Treatment: 0 Visit Information History Since Last Visit Added or deleted any medications: No Patient Arrived: Ambulatory Any new allergies or adverse reactions: No Arrival Time: 08:35 Had a fall or experienced change in No Accompanied By: self activities of daily living that may affect Transfer Assistance: None risk of falls: Patient Identification Verified: Yes Signs or symptoms of abuse/neglect since last visito No Patient Has Alerts: Yes Hospitalized since last visit: No Patient Alerts: DMII Implantable device outside of the clinic excluding No cellular tissue based products placed in the center since last visit: Has Dressing in Place as Prescribed: Yes Has Compression in Place as Prescribed: Yes Pain Present Now: No Electronic Signature(s) Signed: 07/23/2019 5:03:46 PM By: Montey Hora Entered By: Montey Hora on 07/23/2019 08:35:58 [name], [name] ([id number]) -------------------------------------------------------------------------------- Clinic Level of Care Assessment Details Patient Name: [name], [name] Date of Service: 07/23/2019 8:30 AM Medical Record Number: [id number] Patient Account Number: [id number] Date of Birth/Sex: Oct 22, 1945 (74 y.o. F) Treating RN: Army Melia Primary Care Buelah Rennie: Domenick Gong Other Clinician: Referring Betha Shadix: Domenick Gong Treating Morghan Kester/Extender: Tito Dine in Treatment: 0 Clinic Level of Care Assessment Items TOOL 4 Quantity Score []  - Use when only an EandM is  performed on FOLLOW-UP visit 0 ASSESSMENTS - Nursing Assessment / Reassessment X - Reassessment of Co-morbidities (includes updates in patient status) 1 10 X- 1 5 Reassessment of Adherence to Treatment Plan ASSESSMENTS - Wound and Skin Assessment / Reassessment X - Simple Wound Assessment / Reassessment - one wound 1 5 []  - 0 Complex Wound Assessment / Reassessment - multiple wounds []  - 0 Dermatologic / Skin Assessment (not related to wound area) ASSESSMENTS - Focused Assessment []  - Circumferential Edema Measurements - multi extremities 0 []  - 0 Nutritional Assessment / Counseling / Intervention []  - 0 Lower Extremity Assessment (monofilament, tuning fork, pulses) []  - 0 Peripheral Arterial Disease Assessment (using hand held doppler) ASSESSMENTS - Ostomy and/or Continence Assessment and Care []  - Incontinence Assessment and Management 0 []  - 0 Ostomy Care Assessment and Management (repouching, etc.) PROCESS - Coordination of Care X - Simple Patient / Family Education for ongoing care 1 15 []  - 0 Complex (extensive) Patient / Family Education for ongoing care []  - 0 Staff obtains Programmer, systems, Records, Test Results / Process Orders []  - 0 Staff telephones HHA, Nursing Homes / Clarify orders / etc []  - 0 Routine Transfer to another Facility (non-emergent condition) []  - 0 Routine Hospital Admission (non-emergent condition) []  - 0 New Admissions / Biomedical engineer / Ordering NPWT, Apligraf, etc. []  - 0 Emergency Hospital Admission (emergent condition) X- 1 10 Simple Discharge Coordination []  - 0 Complex (extensive) Discharge Coordination PROCESS - Special Needs []  - Pediatric / Minor Patient Management 0 []  - 0 Isolation Patient Management []  - 0 Hearing / Language / Visual special needs []  - 0 Assessment of Community assistance (transportation, D/C planning, etc.) []  - 0 Additional assistance / Altered mentation []  - 0 Support Surface(s) Assessment (bed,  cushion, seat, etc.) INTERVENTIONS - Wound Cleansing / Measurement [name], [name] ([id number]) X- 1 5 Simple Wound Cleansing - one wound []  - 0 Complex Wound Cleansing -  multiple wounds X- 1 5 Wound Imaging (photographs - any number of wounds) []  - 0 Wound Tracing (instead of photographs) X- 1 5 Simple Wound Measurement - one wound []  - 0 Complex Wound Measurement - multiple wounds INTERVENTIONS - Wound Dressings []  - Small Wound Dressing one or multiple wounds 0 X- 1 15 Medium Wound Dressing one or multiple wounds []  - 0 Large Wound Dressing one or multiple wounds []  - 0 Application of Medications - topical []  - 0 Application of Medications - injection INTERVENTIONS - Miscellaneous []  - External ear exam 0 []  - 0 Specimen Collection (cultures, biopsies, blood, body fluids, etc.) []  - 0 Specimen(s) / Culture(s) sent or taken to Lab for analysis []  - 0 Patient Transfer (multiple staff / Civil Service fast streamer / Similar devices) []  - 0 Simple Staple / Suture removal (25 or less) []  - 0 Complex Staple / Suture removal (26 or more) []  - 0 Hypo / Hyperglycemic Management (close monitor of Blood Glucose) []  - 0 Ankle / Brachial Index (ABI) - do not check if billed separately X- 1 5 Vital Signs Has the patient been seen at the hospital within the last three years: Yes Total Score: 80 Level Of Care: New/Established - Level 3 Electronic Signature(s) Signed: 07/23/2019 11:19:24 AM By: Army Melia Entered By: Army Melia on 07/23/2019 09:04:59 [name], [name] ([id number]) -------------------------------------------------------------------------------- Encounter Discharge Information Details Patient Name: [name], [name] Date of Service: 07/23/2019 8:30 AM Medical Record Number: [id number] Patient Account Number: [id number] Date of Birth/Sex: Jan 01, 1946 (74 y.o. F) Treating RN: Army Melia Primary Care [place]: Domenick Gong Other Clinician: Referring Bakary Bramer: Domenick Gong Treating Taurus Willis/Extender: Tito Dine in Treatment: 0 Encounter Discharge Information Items Discharge Condition: Stable Ambulatory Status: Ambulatory Discharge Destination: Home Transportation: Private Auto Accompanied By: self Schedule Follow-up Appointment: Yes Clinical Summary of Care: Electronic Signature(s) Signed: 07/23/2019 11:19:24 AM By: Army Melia Entered By: Army Melia on 07/23/2019 [place], [place]. ([id number]) -------------------------------------------------------------------------------- Lower Extremity Assessment Details Patient Name: [name], [name] Date of Service: 07/23/2019 8:30 AM Medical Record Number: [id number] Patient Account Number: [id number] Date of Birth/Sex: 1945/05/21 (74 y.o. F) Treating RN: Montey Hora Primary Care Kathleene Bergemann: Domenick Gong Other Clinician: Referring Aston Lawhorn: Domenick Gong Treating Sheria Rosello/Extender: Ricard Dillon Weeks in Treatment: 0 Edema Assessment Assessed: [Left: No] [Right: No] Edema: [Left: N] [Right: o] Vascular Assessment Pulses: Dorsalis Pedis Palpable: [Right:Yes] Electronic Signature(s) Signed: 07/23/2019 5:03:46 PM By: Montey Hora Entered By: Montey Hora on 07/23/2019 08:37:18 [place], [name] ([id number]) -------------------------------------------------------------------------------- Multi Wound Chart Details Patient Name: [name], Rocio C. Date of Service: 07/23/2019 8:30 AM Medical Record Number: [id number] Patient Account Number: [id number] Date of Birth/Sex: 1945/12/18 (74 y.o. F) Treating RN: Army Melia Primary Care Britanie Harshman: Domenick Gong Other Clinician: Referring Erik Burkett: Domenick Gong Treating Mada Sadik/Extender: Tito Dine in Treatment: 0 Vital Signs Height(in): 59 Pulse(bpm): 56 Weight(lbs): 178 Blood Pressure(mmHg): 145/56 Body Mass Index(BMI): 36 Temperature(F): 98.4 Respiratory Rate(breaths/min): 16 Photos:  [N/A:N/A] Wound Location: Right, Anterior Lower Leg N/A N/A Wounding Event: Trauma N/A N/A Primary Etiology: Trauma, Other N/A N/A Comorbid History: Hypertension, Type II Diabetes N/A N/A Date Acquired: 06/20/2019 N/A N/A Weeks of Treatment: 0 N/A N/A Wound Status: Open N/A N/A Measurements L x W x D (cm) 2x1.5x0.5 N/A N/A Area (cm) : 2.356 N/A N/A Volume (cm) : 1.178 N/A N/A % Reduction in Area: 82.00% N/A N/A % Reduction in Volume: 9.90% N/A N/A Position 1 (o'clock): 3 Maximum Distance 1 (cm): 2.5 Tunneling: Yes N/A N/A  Classification: Full Thickness Without Exposed N/A N/A Support Structures Exudate Amount: Medium N/A N/A Exudate Type: Serosanguineous N/A N/A Exudate Color: red, brown N/A N/A Wound Margin: Flat and Intact N/A N/A Granulation Amount: Medium (34-66%) N/A N/A Granulation Quality: Red N/A N/A Necrotic Amount: Medium (34-66%) N/A N/A Necrotic Tissue: Eschar, Adherent Slough N/A N/A Exposed Structures: Fat Layer (Subcutaneous Tissue) N/A N/A Exposed: Yes Fascia: No Tendon: No Muscle: No Joint: No Bone: No Epithelialization: None N/A N/A Treatment Notes Electronic Signature(s) Signed: 07/23/2019 5:28:47 PM By: Linton Ham MD Entered By: Linton Ham on 07/23/2019 09:05:23 [name], [name] [name], [name] [place] ([id number]) -------------------------------------------------------------------------------- Multi-Disciplinary Care Plan Details Patient Name: [name], Kaylah C. Date of Service: 07/23/2019 8:30 AM Medical Record Number: [id number] Patient Account Number: [id number] Date of Birth/Sex: June 11, 1945 (74 y.o. F) Treating RN: Army Melia Primary Care Francesca Strome: Domenick Gong Other Clinician: Referring Tye Juarez: Domenick Gong Treating Ruvi Fullenwider/Extender: Tito Dine in Treatment: 0 Active Inactive Abuse / Safety / Falls / Self Care Management Nursing Diagnoses: Potential for falls Goals: Patient will not experience any  injury related to falls Date Initiated: 07/17/2019 Target Resolution Date: 10/17/2019 Goal Status: Active Interventions: Assess fall risk on admission and as needed Notes: Necrotic Tissue Nursing Diagnoses: Impaired tissue integrity related to necrotic/devitalized tissue Goals: Necrotic/devitalized tissue will be minimized in the wound bed Date Initiated: 07/17/2019 Target Resolution Date: 10/17/2019 Goal Status: Active Interventions: Provide education on necrotic tissue and debridement process Notes: Orientation to the Wound Care Program Nursing Diagnoses: Knowledge deficit related to the wound healing center program Goals: Patient/caregiver will verbalize understanding of the [facility] Program Date Initiated: 07/17/2019 Target Resolution Date: 10/17/2019 Goal Status: Active Interventions: Provide education on orientation to the wound center Notes: Wound/Skin Impairment Nursing Diagnoses: Impaired tissue integrity Goals: Ulcer/skin breakdown will heal within 14 weeks Date Initiated: 07/17/2019 Target Resolution Date: 10/17/2019 Goal Status: Active IZA, ARREY ([id number]) Interventions: Assess patient/caregiver ability to obtain necessary supplies Assess patient/caregiver ability to perform ulcer/skin care regimen upon admission and as needed Assess ulceration(s) every visit Notes: Electronic Signature(s) Signed: 07/23/2019 11:19:24 AM By: Army Melia Entered By: Army Melia on 07/23/2019 08:54:29 [name], [name] ([id number]) -------------------------------------------------------------------------------- Pain Assessment Details Patient Name: [name], Mckenley C. Date of Service: 07/23/2019 8:30 AM Medical Record Number: [id number] Patient Account Number: [id number] Date of Birth/Sex: [birth year] (74 y.o. F) Treating RN: Montey Hora Primary Care Acel Natzke: Domenick Gong Other Clinician: Referring [place] Retana: Domenick Gong Treating Shiah Berhow/Extender: Tito Dine in Treatment: 0 Active Problems Location of Pain Severity and Description of Pain Patient Has Paino No Site Locations Pain Management and Medication Current Pain Management: Electronic Signature(s) Signed: 07/23/2019 5:03:46 PM By: Montey Hora Entered By: Montey Hora on 07/23/2019 08:36:51 [place], [place] ([id number]) -------------------------------------------------------------------------------- Patient/Caregiver Education Details Patient Name: [name], Odile C. Date of Service: 07/23/2019 8:30 AM Medical Record Number: [id number] Patient Account Number: [id number] Date of Birth/Gender: 08/27/1945 (74 y.o. F) Treating RN: Army Melia Primary Care Physician: Domenick Gong Other Clinician: Referring Physician: Domenick Gong Treating Physician/Extender: Tito Dine in Treatment: 0 Education Assessment Education Provided To: Patient Education Topics Provided Wound/Skin Impairment: Handouts: Caring for Your Ulcer Methods: Demonstration, Explain/Verbal Responses: State content correctly Electronic Signature(s) Signed: 07/23/2019 11:19:24 AM By: Army Melia Entered By: Army Melia on 07/23/2019 09:05:25 [place], [place]. ([id number]) -------------------------------------------------------------------------------- Wound Assessment Details Patient Name: [name], [name] Date of Service: 07/23/2019 8:30 AM Medical Record Number: [id number] Patient Account Number: [id number] Date of Birth/Sex: 06/26/1945 (74 y.o. F) Treating RN:  Montey Hora Primary Care Jacqulene Huntley: Domenick Gong Other Clinician: Referring Twala Collings: Domenick Gong Treating Iriana Artley/Extender: Tito Dine in Treatment: 0 Wound Status Wound Number: 1 Primary Etiology: Trauma, Other Wound Location: Right, Anterior Lower Leg Wound Status: Open Wounding Event: Trauma Comorbid History: Hypertension, Type II Diabetes Date Acquired: 06/20/2019 Weeks Of Treatment:  0 Clustered Wound: No Photos Wound Measurements Length: (cm) 2 Width: (cm) 1.5 Depth: (cm) 0.5 Area: (cm) 2.356 Volume: (cm) 1.178 % Reduction in Area: 82% % Reduction in Volume: 9.9% Epithelialization: None Tunneling: Yes Position (o'clock): 3 Maximum Distance: (cm) 2.5 Undermining: No Wound Description Classification: Full Thickness Without Exposed Support Structures Wound Margin: Flat and Intact Exudate Amount: Medium Exudate Type: Serosanguineous Exudate Color: red, brown Foul Odor After Cleansing: No Slough/Fibrino Yes Wound Bed Granulation Amount: Medium (34-66%) Exposed Structure Granulation Quality: Red Fascia Exposed: No Necrotic Amount: Medium (34-66%) Fat Layer (Subcutaneous Tissue) Exposed: Yes Necrotic Quality: Eschar, Adherent Slough Tendon Exposed: No Muscle Exposed: No Joint Exposed: No Bone Exposed: No Treatment Notes Wound #1 (Right, Anterior Lower Leg) Notes TCA, iodoform, scell, abd, conform and tubigrip f [place], Malani C. ([id number]) Electronic Signature(s) Signed: 07/23/2019 5:03:46 PM By: Montey Hora Entered By: Montey Hora on 07/23/2019 [place], [name] ([id number]) -------------------------------------------------------------------------------- Vitals Details Patient Name: [name], Riya C. Date of Service: 07/23/2019 8:30 AM Medical Record Number: [id number] Patient Account Number: [id number] Date of Birth/Sex: Oct 05, 1945 (74 y.o. F) Treating RN: Montey Hora Primary Care Oracio Galen: Domenick Gong Other Clinician: Referring Orchid Glassberg: Domenick Gong Treating Rito Lecomte/Extender: Tito Dine in Treatment: 0 Vital Signs Time Taken: 08:36 Temperature (F): 98.4 Height (in): 59 Pulse (bpm): 56 Weight (lbs): 178 Respiratory Rate (breaths/min): 16 Body Mass Index (BMI): 35.9 Blood Pressure (mmHg): 145/56 Reference Range: 80 - 120 mg / dl Electronic Signature(s) Signed: 07/23/2019 5:03:46 PM By: Montey Hora Entered By: Montey Hora on 07/23/2019 08:37:08

## 2019-07-26 LAB — AEROBIC CULTURE W GRAM STAIN (SUPERFICIAL SPECIMEN)

## 2019-07-28 ENCOUNTER — Other Ambulatory Visit: Payer: Self-pay

## 2019-07-28 DIAGNOSIS — E11622 Type 2 diabetes mellitus with other skin ulcer: Secondary | ICD-10-CM | POA: Diagnosis not present

## 2019-07-28 DIAGNOSIS — S81801A Unspecified open wound, right lower leg, initial encounter: Secondary | ICD-10-CM | POA: Diagnosis not present

## 2019-07-28 DIAGNOSIS — I10 Essential (primary) hypertension: Secondary | ICD-10-CM | POA: Diagnosis not present

## 2019-07-28 NOTE — Progress Notes (Signed)
TAMIKA, THUN ([id number]) Visit Report for 07/28/2019 Arrival Information Details Patient Name: BANKEN, Cayle C. Date of Service: 07/28/2019 8:45 AM Medical Record Number: [id number] Patient Account Number: [id number] Date of Birth/Sex: 1946/02/10 (74 y.o. F) Treating RN: Army Melia Primary Care [place]: Domenick Gong Other Clinician: Referring Tyrone Balash: Domenick Gong Treating Jarelly Rinck/Extender: Tito Dine in Treatment: 1 Visit Information History Since Last Visit Added or deleted any medications: No Patient Arrived: Ambulatory Any new allergies or adverse reactions: No Arrival Time: 08:45 Had a fall or experienced change in No Accompanied By: self activities of daily living that may affect Transfer Assistance: None risk of falls: Patient Identification Verified: Yes Signs or symptoms of abuse/neglect since last visito No Patient Has Alerts: Yes Hospitalized since last visit: No Patient Alerts: DMII Has Dressing in Place as Prescribed: Yes Pain Present Now: No Electronic Signature(s) Signed: 07/28/2019 10:45:21 AM By: Army Melia Entered By: Army Melia on 07/28/2019 08:48:44 Delfavero, Baylin C. ([id number]) -------------------------------------------------------------------------------- Clinic Level of Care Assessment Details Patient Name: Danielle Dess, Jahnia C. Date of Service: 07/28/2019 8:45 AM Medical Record Number: [id number] Patient Account Number: [id number] Date of Birth/Sex: 01-04-1946 (74 y.o. F) Treating RN: Army Melia Primary Care Reynald Woods: Domenick Gong Other Clinician: Referring Xzander Gilham: Domenick Gong Treating Melainie Krinsky/Extender: Tito Dine in Treatment: 1 Clinic Level of Care Assessment Items TOOL 4 Quantity Score []  - Use when only an EandM is performed on FOLLOW-UP visit 0 ASSESSMENTS - Nursing Assessment / Reassessment X - Reassessment of Co-morbidities (includes updates in patient status) 1 10 X- 1 5 Reassessment  of Adherence to Treatment Plan ASSESSMENTS - Wound and Skin Assessment / Reassessment X - Simple Wound Assessment / Reassessment - one wound 1 5 []  - 0 Complex Wound Assessment / Reassessment - multiple wounds []  - 0 Dermatologic / Skin Assessment (not related to wound area) ASSESSMENTS - Focused Assessment []  - Circumferential Edema Measurements - multi extremities 0 []  - 0 Nutritional Assessment / Counseling / Intervention []  - 0 Lower Extremity Assessment (monofilament, tuning fork, pulses) []  - 0 Peripheral Arterial Disease Assessment (using hand held doppler) ASSESSMENTS - Ostomy and/or Continence Assessment and Care []  - Incontinence Assessment and Management 0 []  - 0 Ostomy Care Assessment and Management (repouching, etc.) PROCESS - Coordination of Care X - Simple Patient / Family Education for ongoing care 1 15 []  - 0 Complex (extensive) Patient / Family Education for ongoing care X- 1 10 Staff obtains Programmer, systems, Records, Test Results / Process Orders []  - 0 Staff telephones HHA, Nursing Homes / Clarify orders / etc []  - 0 Routine Transfer to another Facility (non-emergent condition) []  - 0 Routine Hospital Admission (non-emergent condition) []  - 0 New Admissions / Biomedical engineer / Ordering NPWT, Apligraf, etc. []  - 0 Emergency Hospital Admission (emergent condition) X- 1 10 Simple Discharge Coordination []  - 0 Complex (extensive) Discharge Coordination PROCESS - Special Needs []  - Pediatric / Minor Patient Management 0 []  - 0 Isolation Patient Management []  - 0 Hearing / Language / Visual special needs []  - 0 Assessment of Community assistance (transportation, D/C planning, etc.) []  - 0 Additional assistance / Altered mentation []  - 0 Support Surface(s) Assessment (bed, cushion, seat, etc.) INTERVENTIONS - Wound Cleansing / Measurement [place], Virlee C. ([id number]) X- 1 5 Simple Wound Cleansing - one wound []  - 0 Complex Wound Cleansing -  multiple wounds []  - 0 Wound Imaging (photographs - any number of wounds) []  - 0 Wound Tracing (instead of photographs) X- 1 5 Simple  Wound Measurement - one wound []  - 0 Complex Wound Measurement - multiple wounds INTERVENTIONS - Wound Dressings []  - Small Wound Dressing one or multiple wounds 0 X- 1 15 Medium Wound Dressing one or multiple wounds []  - 0 Large Wound Dressing one or multiple wounds []  - 0 Application of Medications - topical []  - 0 Application of Medications - injection INTERVENTIONS - Miscellaneous []  - External ear exam 0 []  - 0 Specimen Collection (cultures, biopsies, blood, body fluids, etc.) []  - 0 Specimen(s) / Culture(s) sent or taken to Lab for analysis []  - 0 Patient Transfer (multiple staff / Civil Service fast streamer / Similar devices) []  - 0 Simple Staple / Suture removal (25 or less) []  - 0 Complex Staple / Suture removal (26 or more) []  - 0 Hypo / Hyperglycemic Management (close monitor of Blood Glucose) []  - 0 Ankle / Brachial Index (ABI) - do not check if billed separately X- 1 5 Vital Signs Has the patient been seen at the hospital within the last three years: Yes Total Score: 85 Level Of Care: New/Established - Level 3 Electronic Signature(s) Signed: 07/28/2019 10:45:21 AM By: Army Melia Entered By: Army Melia on 07/28/2019 08:53:46 [place]az[place], Kashonda C. ([id number]) -------------------------------------------------------------------------------- Encounter Discharge Information Details Patient Name: Danielle Dess, Ryin C. Date of Service: 07/28/2019 8:45 AM Medical Record Number: [id number] Patient Account Number: [id number] Date of Birth/Sex: 01/28/1946 (74 y.o. F) Treating RN: Army Melia Primary Care [place]: Domenick Gong Other Clinician: Referring Shany Marinez: Domenick Gong Treating Omie Ferger/Extender: Tito Dine in Treatment: 1 Encounter Discharge Information Items Discharge Condition: Stable Ambulatory Status:  Ambulatory Discharge Destination: Home Transportation: Private Auto Accompanied By: self Schedule Follow-up Appointment: Yes Clinical Summary of Care: Electronic Signature(s) Signed: 07/28/2019 10:45:21 AM By: Army Melia Entered By: Army Melia on 07/28/2019 08:53:05 [place], Azora C. ([id number]) -------------------------------------------------------------------------------- Wound Assessment Details Patient Name: Rhude, Conor C. Date of Service: 07/28/2019 8:45 AM Medical Record Number: [id number] Patient Account Number: [id number] Date of Birth/Sex: [birth year] (74 y.o. F) Treating RN: Army Melia Primary Care Quantina Dershem: Domenick Gong Other Clinician: Referring Luisa Louk: Domenick Gong Treating Ercia Crisafulli/Extender: Tito Dine in Treatment: 1 Wound Status Wound Number: 1 Primary Etiology: Trauma, Other Wound Location: Right, Anterior Lower Leg Wound Status: Open Wounding Event: Trauma Comorbid History: Hypertension, Type II Diabetes Date Acquired: 06/20/2019 Weeks Of Treatment: 1 Clustered Wound: No Wound Measurements Length: (cm) 0.3 Width: (cm) 0.4 Depth: (cm) 0.3 Area: (cm) 0.094 Volume: (cm) 0.028 % Reduction in Area: 99.3% % Reduction in Volume: 97.9% Epithelialization: None Tunneling: No Undermining: Yes Starting Position (o'clock): 2 Ending Position (o'clock): 3 Maximum Distance: (cm) 1.1 Wound Description Classification: Full Thickness Without Exposed Support Structures Wound Margin: Flat and Intact Exudate Amount: Medium Exudate Type: Serosanguineous Exudate Color: red, brown Foul Odor After Cleansing: No Slough/Fibrino Yes Wound Bed Granulation Amount: Medium (34-66%) Exposed Structure Granulation Quality: Red Fascia Exposed: No Necrotic Amount: Medium (34-66%) Fat Layer (Subcutaneous Tissue) Exposed: Yes Necrotic Quality: Eschar, Adherent Slough Tendon Exposed: No Muscle Exposed: No Joint Exposed: No Bone Exposed:  No Treatment Notes Wound #1 (Right, Anterior Lower Leg) Notes TCA, iodoform, scell, abd, conform and tubigrip f Electronic Signature(s) Signed: 07/28/2019 10:45:21 AM By: Army Melia Entered By: Army Melia on 07/28/2019 08:49:12

## 2019-07-29 DIAGNOSIS — L570 Actinic keratosis: Secondary | ICD-10-CM | POA: Diagnosis not present

## 2019-07-29 DIAGNOSIS — D1801 Hemangioma of skin and subcutaneous tissue: Secondary | ICD-10-CM | POA: Diagnosis not present

## 2019-07-29 DIAGNOSIS — L720 Epidermal cyst: Secondary | ICD-10-CM | POA: Diagnosis not present

## 2019-07-29 DIAGNOSIS — L400 Psoriasis vulgaris: Secondary | ICD-10-CM | POA: Diagnosis not present

## 2019-07-29 DIAGNOSIS — Z85820 Personal history of malignant melanoma of skin: Secondary | ICD-10-CM | POA: Diagnosis not present

## 2019-07-29 DIAGNOSIS — L918 Other hypertrophic disorders of the skin: Secondary | ICD-10-CM | POA: Diagnosis not present

## 2019-07-30 ENCOUNTER — Other Ambulatory Visit: Payer: Self-pay

## 2019-07-30 ENCOUNTER — Encounter: Payer: Medicare Other | Admitting: Internal Medicine

## 2019-07-30 DIAGNOSIS — E11628 Type 2 diabetes mellitus with other skin complications: Secondary | ICD-10-CM | POA: Diagnosis not present

## 2019-07-30 DIAGNOSIS — S81801A Unspecified open wound, right lower leg, initial encounter: Secondary | ICD-10-CM | POA: Diagnosis not present

## 2019-07-30 DIAGNOSIS — I10 Essential (primary) hypertension: Secondary | ICD-10-CM | POA: Diagnosis not present

## 2019-07-30 DIAGNOSIS — E11622 Type 2 diabetes mellitus with other skin ulcer: Secondary | ICD-10-CM | POA: Diagnosis not present

## 2019-07-31 NOTE — Progress Notes (Signed)
SOLI, ORAVETZ ([id number]) Visit Report for 07/30/2019 HPI Details Patient Name: [name], [name] Date of Service: 07/30/2019 8:30 AM Medical Record Number: [id number] Patient Account Number: [id number] Date of Birth/Sex: 1945/09/05 (74 y.o. F) Treating RN: Army Melia Primary Care Provider: Domenick Gong Other Clinician: Referring Provider: Domenick Gong Treating Provider/Extender: Tito Dine in Treatment: 1 History of Present Illness HPI Description: 07/17/2019 upon evaluation today patient presents for initial evaluation here in our clinic concerning issues with her right lower extremity. She subsequently sustained an injury on April 10 when she dropped something onto her leg causing a skin tear/hematoma. She was seen in urgent care where this was sutured and then subsequently the sutures are still in place. She was also given doxycycline about 10 days ago but fortunately seems to be doing a lot better in that regard as far as any infection is concerned I think the doxycycline is done well. There does not appear to be any signs of active infection which is good news. No fevers, chills, nausea, vomiting, or diarrhea. The patient does have a history of diabetes and high blood pressure but otherwise is fairly healthy. She does have some lower extremity edema and she also does have psoriasis. 5/13; patient was in for a nurse visit on Monday. She now has 3 small open areas on the upper right tibial area. These do not interconnect however there is a wide amount of undermining from the larger open area medially. Serosanguineous drainage. She has only been in a Tubigrip I think this is because of the underlying psoriasis she needs to be able to lotion the skin on her legs. 5/20; I saw the patient last week with 3 small areas on the upper right tibia which was wound area initially caused by trauma. 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 CULTURE I did last time showed Enterococcus as well as Staphylococcus lugdunensis. The latter is a coag negative staph but is sometimes pathogenic rather than a skin contaminant. The Enterococcus would have not been reliably covered by Bactrim. I therefore put her on linezolid for 5 days. She is not tolerating this well with nausea and loose stools however she is taking it with food and seems to be tolerating enough to complete the course. Fortunately today the wound left bed looks better we have been using silver alginate Electronic Signature(s) Signed: 07/30/2019 5:46:06 PM By: Linton Ham MD Entered By: Linton Ham on 07/30/2019 09:07:32 [place], Anette C. ([id number]) -------------------------------------------------------------------------------- Physical Exam Details Patient Name: Hiscox, Forest C. Date of Service: 07/30/2019 8:30 AM Medical Record Number: [id number] Patient Account Number: [id number] Date of Birth/Sex: August 30, 1945 (74 y.o. F) Treating RN: Army Melia Primary Care Provider: Domenick Gong Other Clinician: Referring Provider: Domenick Gong Treating Provider/Extender: Tito Dine in Treatment: 1 Constitutional Sitting or standing Blood Pressure is within target range for patient.. Pulse regular and within target range for patient.Marland Kitchen Respirations regular, non- labored and within target range.. Temperature is normal and within the target range for the patient.Marland Kitchen appears in no distress. Cardiovascular Pedal pulses are palpable. Integumentary (Hair, Skin) Psoriatic plaques but this looks fairly well controlled. She is only using topical agents. Notes Wound exam; she had 3 open areas last week now with 1 open area. There is undermining medially although this seems to be better than I remember from last time. Minimal drainage which is also an improvement. No palpable tenderness. Electronic Signature(s) Signed: 07/30/2019 5:46:06 PM By: Linton Ham  MD Entered By: Linton Ham on 07/30/2019 09:08:58 Lonia Mad ([id number]) -------------------------------------------------------------------------------- Physician Orders Details Patient Name: [name] Dess, Khamille C. Date of Service: 07/30/2019 8:30 AM Medical Record Number: [id number] Patient Account Number: [id number] Date of Birth/Sex: December 16, 1945 (74 y.o. F) Treating RN: Army Melia Primary Care Provider: Domenick Gong Other Clinician: Referring Provider: Domenick Gong Treating Provider/Extender: Tito Dine in Treatment: 1 Verbal / Phone Orders: No Diagnosis Coding Wound Cleansing Wound #1 Right,Anterior Lower Leg o Clean wound with Normal Saline. Primary Wound Dressing Wound #1 Right,Anterior Lower Leg o Silver Alginate o Iodoform packing Gauze Secondary Dressing Wound #1 Right,Anterior Lower Leg o ABD and Kerlix/Conform Dressing Change Frequency Wound #1 Right,Anterior Lower Leg o Other: - twice weekly Follow-up Appointments o Return Appointment in 1 week. o Nurse Visit as needed Edema Control Wound #1 Right,Anterior Lower Leg o Elevate legs to the level of the heart and pump ankles as often as possible o Other: - TubiGrip F Electronic Signature(s) Signed: 07/30/2019 9:50:05 AM By: Army Melia Signed: 07/30/2019 5:46:06 PM By: Linton Ham MD Entered By: Army Melia on 07/30/2019 09:01:33 [place], Deerica C. ([id number]) -------------------------------------------------------------------------------- Problem List Details Patient Name: Mcguiness, Adelynne C. Date of Service: 07/30/2019 8:30 AM Medical Record Number: [id number] Patient Account Number: [id number] Date of Birth/Sex: Feb 12, 1946 (74 y.o. F) Treating RN: Army Melia Primary Care Provider: Domenick Gong Other Clinician: Referring Provider: Domenick Gong Treating Provider/Extender: Tito Dine in Treatment: 1 Active  Problems ICD-10 Encounter Code Description Active Date MDM Diagnosis S81.801A Unspecified open wound, right lower leg, initial encounter 07/17/2019 No Yes E11.622 Type 2 diabetes mellitus with other skin ulcer 07/17/2019 No Yes I10 Essential (primary) hypertension 07/17/2019 No Yes Inactive Problems Resolved Problems Electronic Signature(s) Signed: 07/30/2019 5:46:06 PM By: Linton Ham MD Entered By: Linton Ham on 07/30/2019 09:04:24 [place], Omer Loletha Grayer ([id number]) -------------------------------------------------------------------------------- Progress Note Details Patient Name: [name] Dess, Aryelle C. Date of Service: 07/30/2019 8:30 AM Medical Record Number: [id number] Patient Account Number: [id number] Date of Birth/Sex: 05-Sep-1945 (74 y.o. F) Treating RN: Army Melia Primary Care Provider: Domenick Gong Other Clinician: Referring Provider: Domenick Gong Treating Provider/Extender: Tito Dine in Treatment: 1 Subjective History of Present Illness (HPI) 07/17/2019 upon evaluation today patient presents for initial evaluation here in our clinic concerning issues with her right lower extremity. She subsequently sustained an injury on April 10 when she dropped something onto her leg causing a skin tear/hematoma. She was seen in urgent care where this was sutured and then subsequently the sutures are still in place. She was also given doxycycline about 10 days ago but fortunately seems to be doing a lot better in that regard as far as any infection is concerned I think the doxycycline is done well. There does not appear to be any signs of active infection which is good news. No fevers, chills, nausea, vomiting, or diarrhea. The patient does have a history of diabetes and high blood pressure but otherwise is fairly healthy. She does have some lower extremity edema and she also does have psoriasis. 5/13; patient was in for a nurse visit on Monday. She now has 3 small open areas  on the upper right tibial area. These do not interconnect however there is a wide amount of undermining from the larger open area medially. Serosanguineous drainage. She has only been in a Tubigrip I think this is because of the underlying psoriasis she needs to be able to lotion the skin on her legs. 5/20; I saw the patient last week with  3 small areas on the upper right tibia which was wound area initially caused by trauma. 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 CULTURE I did last time showed Enterococcus as well as Staphylococcus lugdunensis. The latter is a coag negative staph but is sometimes pathogenic rather than a skin contaminant. The Enterococcus would have not been reliably covered by Bactrim. I therefore put her on linezolid for 5 days. She is not tolerating this well with nausea and loose stools however she is taking it with food and seems to be tolerating enough to complete the course. Fortunately today the wound left bed looks better we have been using silver alginate Objective Constitutional Sitting or standing Blood Pressure is within target range for patient.. Pulse regular and within target range for patient.Marland Kitchen Respirations regular, non- labored and within target range.. Temperature is normal and within the target range for the patient.Marland Kitchen appears in no distress. Vitals Time Taken: 8:39 AM, Height: 59 in, Weight: 178 lbs, BMI: 35.9, Temperature: 98.6 F, Pulse: 59 bpm, Respiratory Rate: 16 breaths/min, Blood Pressure: 133/48 mmHg. Cardiovascular Pedal pulses are palpable. General Notes: Wound exam; she had 3 open areas last week now with 1 open area. There is undermining medially although this seems to be better than I remember from last time. Minimal drainage which is also an improvement. No palpable tenderness. Integumentary (Hair, Skin) Psoriatic plaques but this looks fairly well controlled. She is only using topical  agents. Wound #1 status is Open. Original cause of wound was Trauma. The wound is located on the Right,Anterior Lower Leg. The wound measures 0.3cm length x 0.5cm width x 0.4cm depth; 0.118cm^2 area and 0.047cm^3 volume. There is Fat Layer (Subcutaneous Tissue) Exposed exposed. There is no tunneling noted, however, there is undermining starting at 2:00 and ending at 3:00 with a maximum distance of 0.8cm. There is a medium amount of serosanguineous drainage noted. The wound margin is flat and intact. There is large (67-100%) red granulation within the wound bed. There is a small (1-33%) amount of necrotic tissue within the wound bed including Adherent Slough. Assessment Active Problems ICD-10 Unspecified open wound, right lower leg, initial encounter Krizan, Jacynda C. ([id number]) Type 2 diabetes mellitus with other skin ulcer Essential (primary) hypertension Plan Wound Cleansing: Wound #1 Right,Anterior Lower Leg: Clean wound with Normal Saline. Primary Wound Dressing: Wound #1 Right,Anterior Lower Leg: Silver Alginate Iodoform packing Gauze Secondary Dressing: Wound #1 Right,Anterior Lower Leg: ABD and Kerlix/Conform Dressing Change Frequency: Wound #1 Right,Anterior Lower Leg: Other: - twice weekly Follow-up Appointments: Return Appointment in 1 week. Nurse Visit as needed Edema Control: Wound #1 Right,Anterior Lower Leg: Elevate legs to the level of the heart and pump ankles as often as possible Other: - TubiGrip F 1. We used iodoform packing with larynx silver alginate. 2. I have talked to her about completing the last 2-1/2 days of linezolid she seems able to do this. She does not look to be systemically unwell. 3. Initially a traumatic area I am assuming this became secondarily infected 4. Electronic Signature(s) Signed: 07/30/2019 5:46:06 PM By: Linton Ham MD Entered By: Linton Ham on 07/30/2019 [place], [place] Bear LakeMarland Kitchen  ([id number]) -------------------------------------------------------------------------------- [place] Details Patient Name: [name] Dess, Bethan C. Date of Service: 07/30/2019 Medical Record Number: [id number] Patient Account Number: [id number] Date of Birth/Sex: [birth year] (74 y.o. F) Treating RN: Army Melia Primary Care Provider: Domenick Gong Other Clinician: Referring Provider: Domenick Gong Treating Provider/Extender: Ricard Dillon Weeks in Treatment: 1 Diagnosis Coding ICD-10  Codes Code Description [id number] Unspecified open wound, right lower leg, initial encounter E11.622 Type 2 diabetes mellitus with other skin ulcer I10 Essential (primary) hypertension Facility Procedures CPT4 Code: [id number] Description: [id number] - WOUND CARE VISIT-LEV 3 EST PT Modifier: Quantity: 1 Physician Procedures CPT4 Code: [id number] Description: [id number] - WC PHYS LEVEL 3 - EST PT Modifier: Quantity: 1 CPT4 Code: Description: ICD-10 Diagnosis Description [id number] Unspecified open wound, right lower leg, initial encounter E11.622 Type 2 diabetes mellitus with other skin ulcer Modifier: Quantity: Electronic Signature(s) Signed: 07/30/2019 5:46:06 PM By: Linton Ham MD Entered By: Linton Ham on 07/30/2019 09:11:01

## 2019-07-31 NOTE — Progress Notes (Signed)
OLIVET, SMITHERS ([id number]) Visit Report for 07/30/2019 Arrival Information Details Patient Name: [name], [name] Date of Service: 07/30/2019 8:30 AM Medical Record Number: [id number] Patient Account Number: [id number] Date of Birth/Sex: 1945/03/22 (74 y.o. F) Treating RN: [name] Primary Care [name]: [name] Other Clinician: Referring [name]: [name] Treating [name]/Extender: [name] in Treatment: 1 Visit Information History Since Last Visit Added or deleted any medications: No Patient Arrived: Ambulatory Any new allergies or adverse reactions: No Arrival Time: 08:38 Had a fall or experienced change in No Accompanied By: self activities of daily living that may affect Transfer Assistance: None risk of falls: Patient Identification Verified: Yes Signs or symptoms of abuse/neglect since last visito No Secondary Verification Process Completed: Yes Hospitalized since last visit: No Patient Has Alerts: Yes Implantable device outside of the clinic excluding No Patient Alerts: DMII cellular tissue based products placed in the center since last visit: Has Dressing in Place as Prescribed: Yes Has Compression in Place as Prescribed: Yes Pain Present Now: No Electronic Signature(s) Signed: 07/30/2019 5:37:23 PM By: [name] Entered By: [name] on 07/30/2019 08:39:39 Alf, Shandell C. ([id number]) -------------------------------------------------------------------------------- Clinic Level of Care Assessment Details Patient Name: [name], [name] Date of Service: 07/30/2019 8:30 AM Medical Record Number: [id number] Patient Account Number: [id number] Date of Birth/Sex: 03/07/1946 (74 y.o. F) Treating RN: [name] Primary Care [name]: [name] Other Clinician: Referring [name]: [name] Treating Bennetta Rudden/Extender: [name] in Treatment: 1 Clinic Level of Care Assessment Items TOOL 4  Quantity Score []  - Use when only an EandM is performed on FOLLOW-UP visit 0 ASSESSMENTS - Nursing Assessment / Reassessment X - Reassessment of Co-morbidities (includes updates in patient status) 1 10 X- 1 5 Reassessment of Adherence to Treatment Plan ASSESSMENTS - Wound and Skin Assessment / Reassessment X - Simple Wound Assessment / Reassessment - one wound 1 5 []  - 0 Complex Wound Assessment / Reassessment - multiple wounds []  - 0 Dermatologic / Skin Assessment (not related to wound area) ASSESSMENTS - Focused Assessment []  - Circumferential Edema Measurements - multi extremities 0 []  - 0 Nutritional Assessment / Counseling / Intervention []  - 0 Lower Extremity Assessment (monofilament, tuning fork, pulses) []  - 0 Peripheral Arterial Disease Assessment (using hand held doppler) ASSESSMENTS - Ostomy and/or Continence Assessment and Care []  - Incontinence Assessment and Management 0 []  - 0 Ostomy Care Assessment and Management (repouching, etc.) PROCESS - Coordination of Care X - Simple Patient / Family Education for ongoing care 1 15 []  - 0 Complex (extensive) Patient / Family Education for ongoing care []  - 0 Staff obtains Programmer, systems, Records, Test Results / Process Orders []  - 0 Staff telephones HHA, Nursing Homes / Clarify orders / etc []  - 0 Routine Transfer to another Facility (non-emergent condition) []  - 0 Routine Hospital Admission (non-emergent condition) []  - 0 New Admissions / Biomedical engineer / Ordering NPWT, Apligraf, etc. []  - 0 Emergency Hospital Admission (emergent condition) X- 1 10 Simple Discharge Coordination []  - 0 Complex (extensive) Discharge Coordination PROCESS - Special Needs []  - Pediatric / Minor Patient Management 0 []  - 0 Isolation Patient Management []  - 0 Hearing / Language / Visual special needs []  - 0 Assessment of Community assistance (transportation, D/C planning, etc.) []  - 0 Additional assistance / Altered  mentation []  - 0 Support Surface(s) Assessment (bed, cushion, seat, etc.) INTERVENTIONS - Wound Cleansing / Measurement [name], [name] ([id number]) X- 1 5 Simple Wound Cleansing - one wound []  -  0 Complex Wound Cleansing - multiple wounds X- 1 5 Wound Imaging (photographs - any number of wounds) []  - 0 Wound Tracing (instead of photographs) X- 1 5 Simple Wound Measurement - one wound []  - 0 Complex Wound Measurement - multiple wounds INTERVENTIONS - Wound Dressings []  - Small Wound Dressing one or multiple wounds 0 X- 1 15 Medium Wound Dressing one or multiple wounds []  - 0 Large Wound Dressing one or multiple wounds []  - 0 Application of Medications - topical []  - 0 Application of Medications - injection INTERVENTIONS - Miscellaneous []  - External ear exam 0 []  - 0 Specimen Collection (cultures, biopsies, blood, body fluids, etc.) []  - 0 Specimen(s) / Culture(s) sent or taken to Lab for analysis []  - 0 Patient Transfer (multiple staff / Civil Service fast streamer / Similar devices) []  - 0 Simple Staple / Suture removal (25 or less) []  - 0 Complex Staple / Suture removal (26 or more) []  - 0 Hypo / Hyperglycemic Management (close monitor of Blood Glucose) []  - 0 Ankle / Brachial Index (ABI) - do not check if billed separately X- 1 5 Vital Signs Has the patient been seen at the hospital within the last three years: Yes Total Score: 80 Level Of Care: New/Established - Level 3 Electronic Signature(s) Signed: 07/30/2019 9:50:05 AM By: [name] Entered By: [name] on 07/30/2019 09:01:58 [name] -------------------------------------------------------------------------------- Encounter Discharge Information Details Patient Name: [name], [name] Date of Service: 07/30/2019 8:30 AM Medical Record Number: [id number] Patient Account Number: [id number] Date of Birth/Sex: 1945/10/17 (74 y.o. F) Treating RN: [name] Primary Care Jamielynn Wigley: [name] Other Clinician: Referring [name]: [name] Treating [name]/Extender: [name] in Treatment: 1 Encounter Discharge Information Items Discharge Condition: Stable Ambulatory Status: Ambulatory Discharge Destination: Home Transportation: Private Auto Accompanied By: self Schedule Follow-up Appointment: Yes Clinical Summary of Care: Electronic Signature(s) Signed: 07/30/2019 9:50:05 AM By: [name] Entered By: [name] on 07/30/2019 09:02:37 [name], [name] ([id number]) -------------------------------------------------------------------------------- Lower Extremity Assessment Details Patient Name: [name], [name] Date of Service: 07/30/2019 8:30 AM Medical Record Number: [id number] Patient Account Number: [id number] Date of Birth/Sex: 1945/07/11 (74 y.o. F) Treating RN: [name] Primary Care Siris Hoos: [name] Other Clinician: Referring Joelyn Lover: [name] Treating Zitlaly Malson/Extender: [name] in Treatment: 1 Edema Assessment Assessed: [Left: No] [Right: No] Edema: [Left: Ye] [Right: s] Calf Left: Right: Point of Measurement: 29 cm From Medial Instep cm 35 cm Ankle Left: Right: Point of Measurement: 10 cm From Medial Instep cm 22.5 cm Vascular Assessment Pulses: Dorsalis Pedis Palpable: [Right:Yes] Electronic Signature(s) Signed: 07/30/2019 5:37:23 PM By: [name] Entered By: [name] on 07/30/2019 08:48:04 Kooyman, Treasure C. ([id number]) -------------------------------------------------------------------------------- Multi Wound Chart Details Patient Name: [name], [name] Date of Service: 07/30/2019 8:30 AM Medical Record Number: [id number] Patient Account Number: [id number] Date of Birth/Sex: 11-16-1945 (74 y.o. F) Treating RN: [name] Primary Care Kenichi Cassada: [name] Other Clinician: Referring Raul Torrance: [name] Treating Tali Cleaves/Extender: [name] in Treatment: 1 Vital Signs Height(in): 59 Pulse(bpm): 59 Weight(lbs): 178 Blood Pressure(mmHg): 133/48 Body Mass Index(BMI): 36 Temperature(F): 98.6 Respiratory Rate(breaths/min): 16 Photos: [N/A:N/A] Wound Location: Right, Anterior Lower Leg N/A N/A Wounding Event: Trauma N/A N/A Primary Etiology: Trauma, Other N/A N/A Comorbid History: Hypertension, Type II Diabetes N/A N/A Date Acquired: 06/20/2019 N/A N/A Weeks of Treatment: 1 N/A N/A Wound Status: Open N/A N/A Measurements L x W x D (cm) 0.3x0.5x0.4 N/A N/A Area (cm) : 0.118 N/A N/A  Volume (cm) : 0.047 N/A N/A % Reduction in Area: 99.10% N/A N/A % Reduction in Volume: 96.40% N/A N/A Starting Position 1 (o'clock): 2 Ending Position 1 (o'clock): 3 Maximum Distance 1 (cm): 0.8 Undermining: Yes N/A N/A Classification: Full Thickness Without Exposed N/A N/A Support Structures Exudate Amount: Medium N/A N/A Exudate Type: Serosanguineous N/A N/A Exudate Color: red, brown N/A N/A Wound Margin: Flat and Intact N/A N/A Granulation Amount: Large (67-100%) N/A N/A Granulation Quality: Red N/A N/A Necrotic Amount: Small (1-33%) N/A N/A Exposed Structures: Fat Layer (Subcutaneous Tissue) N/A N/A Exposed: Yes Fascia: No Tendon: No Muscle: No Joint: No Bone: No Epithelialization: None N/A N/A Treatment Notes Wound #1 (Right, Anterior Lower Leg) Notes iodoform, scell, abd, conform and tubigrip f [name], [name] ([id number]) Electronic Signature(s) Signed: 07/30/2019 5:46:06 PM By: Linton Ham MD Entered By: Linton Ham on 07/30/2019 [place], [name] ([id number]) -------------------------------------------------------------------------------- Multi-Disciplinary Care Plan Details Patient Name: [name], [name] Date of Service: 07/30/2019 8:30 AM Medical Record Number: [id number] Patient Account Number: [id number] Date of Birth/Sex: Dec 23, 1945 (74 y.o. F) Treating RN: [name] Primary Care  Amilliana Hayworth: [name] Other Clinician: Referring Toddrick Sanna: [name] Treating Parnell Spieler/Extender: [name] in Treatment: 1 Active Inactive Abuse / Safety / Falls / Self Care Management Nursing Diagnoses: Potential for falls Goals: Patient will not experience any injury related to falls Date Initiated: 07/17/2019 Target Resolution Date: 10/17/2019 Goal Status: Active Interventions: Assess fall risk on admission and as needed Notes: Necrotic Tissue Nursing Diagnoses: Impaired tissue integrity related to necrotic/devitalized tissue Goals: Necrotic/devitalized tissue will be minimized in the wound bed Date Initiated: 07/17/2019 Target Resolution Date: 10/17/2019 Goal Status: Active Interventions: Provide education on necrotic tissue and debridement process Notes: Orientation to the Wound Care Program Nursing Diagnoses: Knowledge deficit related to the wound healing center program Goals: Patient/caregiver will verbalize understanding of the [place] Program Date Initiated: 07/17/2019 Target Resolution Date: 10/17/2019 Goal Status: Active Interventions: Provide education on orientation to the wound center Notes: Wound/Skin Impairment Nursing Diagnoses: Impaired tissue integrity Goals: Ulcer/skin breakdown will heal within 14 weeks Date Initiated: 07/17/2019 Target Resolution Date: 10/17/2019 Goal Status: Active JESMINE, DURRAH ([id number]) Interventions: Assess patient/caregiver ability to obtain necessary supplies Assess patient/caregiver ability to perform ulcer/skin care regimen upon admission and as needed Assess ulceration(s) every visit Notes: Electronic Signature(s) Signed: 07/30/2019 9:50:05 AM By: [name] Entered By: [name] on 07/30/2019 08:57:39 Harnisch, Pairlee C. ([id number]) -------------------------------------------------------------------------------- Pain Assessment Details Patient Name: [name], [name] Date of  Service: 07/30/2019 8:30 AM Medical Record Number: [id number] Patient Account Number: [id number] Date of Birth/Sex: 12/06/1945 (74 y.o. F) Treating RN: [name] Primary Care Calem Cocozza: [name] Other Clinician: Referring Joeli Fenner: [name] Treating Jaken Fregia/Extender: [name] in Treatment: 1 Active Problems Location of Pain Severity and Description of Pain Patient Has Paino No Site Locations Pain Management and Medication Current Pain Management: Electronic Signature(s) Signed: 07/30/2019 5:37:23 PM By: [name] Entered By: [name] on 07/30/2019 08:40:05 [name], [name] Loletha [name] ([id number]) -------------------------------------------------------------------------------- Patient/Caregiver Education Details Patient Name: [name], [name] Date of Service: 07/30/2019 8:30 AM Medical Record Number: [id number] Patient Account Number: [id number] Date of Birth/Gender: February 08, 1946 (74 y.o. F) Treating RN: [name] Primary Care Physician: [name] Other Clinician: Referring Physician: Domenick [name] Treating Physician/Extender: [name] in Treatment: 1 Education Assessment Education Provided To: Patient Education Topics Provided Wound/Skin Impairment: Handouts: Caring for Your Ulcer Methods: Demonstration, Explain/Verbal Responses: State content correctly Electronic Signature(s) Signed: 07/30/2019 9:50:05 AM  By: [name] Entered By: [name] on 07/30/2019 09:02:08 [place], [place]. ([id number]) -------------------------------------------------------------------------------- Wound Assessment Details Patient Name: Allman, Lyndell C. Date of Service: 07/30/2019 8:30 AM Medical Record Number: [id number] Patient Account Number: [id number] Date of Birth/Sex: 01-Mar-1946 (74 y.o. F) Treating RN: [name] Primary Care Kou Gucciardo: [name] Other Clinician: Referring Janautica Netzley: [name] Treating  Sabriya Yono/Extender: [name] in Treatment: 1 Wound Status Wound Number: 1 Primary Etiology: Trauma, Other Wound Location: Right, Anterior Lower Leg Wound Status: Open Wounding Event: Trauma Comorbid History: Hypertension, Type II Diabetes Date Acquired: 06/20/2019 Weeks Of Treatment: 1 Clustered Wound: No Photos Wound Measurements Length: (cm) 0.3 Width: (cm) 0.5 Depth: (cm) 0.4 Area: (cm) 0.118 Volume: (cm) 0.047 % Reduction in Area: 99.1% % Reduction in Volume: 96.4% Epithelialization: None Tunneling: No Undermining: Yes Starting Position (o'clock): 2 Ending Position (o'clock): 3 Maximum Distance: (cm) 0.8 Wound Description Classification: Full Thickness Without Exposed Support Structures Wound Margin: Flat and Intact Exudate Amount: Medium Exudate Type: Serosanguineous Exudate Color: red, brown Foul Odor After Cleansing: No Slough/Fibrino Yes Wound Bed Granulation Amount: Large (67-100%) Exposed Structure Granulation Quality: Red Fascia Exposed: No Necrotic Amount: Small (1-33%) Fat Layer (Subcutaneous Tissue) Exposed: Yes Necrotic Quality: Adherent Slough Tendon Exposed: No Muscle Exposed: No Joint Exposed: No Bone Exposed: No Treatment Notes Wound #1 (Right, Anterior Lower Leg) Ingrum, Kalim[place] C. ([id number]) Notes iodoform, scell, abd, conform and tubigrip f Electronic Signature(s) Signed: 07/30/2019 5:37:23 PM By: [name] Entered By: [name] on 07/30/2019 08:46:16 Kohlbeck, Saira C. ([id number]) -------------------------------------------------------------------------------- Vitals Details Patient Name: [name], Doninique C. Date of Service: 07/30/2019 8:30 AM Medical Record Number: [id number] Patient Account Number: [id number] Date of Birth/Sex: 11-06-1945 (74 y.o. F) Treating RN: [name] Primary Care Kammie Scioli: [name] Other Clinician: Referring Shenee Wignall: [name] Treating Telma Pyeatt/Extender: [name] in Treatment: 1 Vital Signs Time Taken: 08:39 Temperature (F): 98.6 Height (in): 59 Pulse (bpm): 59 Weight (lbs): 178 Respiratory Rate (breaths/min): 16 Body Mass Index (BMI): 35.9 Blood Pressure (mmHg): 133/48 Reference Range: 80 - 120 mg / dl Electronic Signature(s) Signed: 07/30/2019 5:37:23 PM By: [name] Entered By: [name] on 07/30/2019 08:40:00

## 2019-08-03 ENCOUNTER — Other Ambulatory Visit: Payer: Self-pay

## 2019-08-03 DIAGNOSIS — E11622 Type 2 diabetes mellitus with other skin ulcer: Secondary | ICD-10-CM | POA: Diagnosis not present

## 2019-08-03 DIAGNOSIS — S81801A Unspecified open wound, right lower leg, initial encounter: Secondary | ICD-10-CM | POA: Diagnosis not present

## 2019-08-03 DIAGNOSIS — I10 Essential (primary) hypertension: Secondary | ICD-10-CM | POA: Diagnosis not present

## 2019-08-03 NOTE — Progress Notes (Signed)
DOLORAS, BERTO ([id number]) Visit Report for 08/03/2019 Arrival Information Details Patient Name: MASSAQUOI, Arthelia C. Date of Service: 08/03/2019 12:15 PM Medical Record Number: [id number] Patient Account Number: [id number] Date of Birth/Sex: 09-01-1945 (74 y.o. F) Treating RN: Montey Hora Primary Care Caterina Racine: Domenick Gong Other Clinician: Referring Mischelle Reeg: Domenick Gong Treating Boaz Berisha/Extender: Melburn Hake, HOYT Weeks in Treatment: 2 Visit Information History Since Last Visit Added or deleted any medications: No Patient Arrived: Ambulatory Any new allergies or adverse reactions: No Arrival Time: 12:42 Had a fall or experienced change in No Accompanied By: self activities of daily living that may affect Transfer Assistance: None risk of falls: Patient Identification Verified: Yes Signs or symptoms of abuse/neglect since last visito No Secondary Verification Process Completed: Yes Hospitalized since last visit: No Patient Has Alerts: Yes Implantable device outside of the clinic excluding No Patient Alerts: DMII cellular tissue based products placed in the center since last visit: Has Dressing in Place as Prescribed: Yes Has Compression in Place as Prescribed: Yes Pain Present Now: No Electronic Signature(s) Signed: 08/03/2019 1:10:16 PM By: Montey Hora Entered By: Montey Hora on 08/03/2019 12:42:32 Hamler, [place]. ([id number]) -------------------------------------------------------------------------------- Clinic Level of Care Assessment Details Patient Name: Danielle Dess, Arthi C. Date of Service: 08/03/2019 12:15 PM Medical Record Number: [id number] Patient Account Number: [id number] Date of Birth/Sex: June 02, 1945 (74 y.o. F) Treating RN: Montey Hora Primary Care Tyarra Nolton: Domenick Gong Other Clinician: Referring Nickcole Bralley: Domenick Gong Treating Latoya Maulding/Extender: Melburn Hake, HOYT Weeks in Treatment: 2 Clinic Level of Care Assessment Items TOOL 4  Quantity Score []  - Use when only an EandM is performed on FOLLOW-UP visit 0 ASSESSMENTS - Nursing Assessment / Reassessment X - Reassessment of Co-morbidities (includes updates in patient status) 1 10 X- 1 5 Reassessment of Adherence to Treatment Plan ASSESSMENTS - Wound and Skin Assessment / Reassessment X - Simple Wound Assessment / Reassessment - one wound 1 5 []  - 0 Complex Wound Assessment / Reassessment - multiple wounds []  - 0 Dermatologic / Skin Assessment (not related to wound area) ASSESSMENTS - Focused Assessment []  - Circumferential Edema Measurements - multi extremities 0 []  - 0 Nutritional Assessment / Counseling / Intervention X- 1 5 Lower Extremity Assessment (monofilament, tuning fork, pulses) []  - 0 Peripheral Arterial Disease Assessment (using hand held doppler) ASSESSMENTS - Ostomy and/or Continence Assessment and Care []  - Incontinence Assessment and Management 0 []  - 0 Ostomy Care Assessment and Management (repouching, etc.) PROCESS - Coordination of Care X - Simple Patient / Family Education for ongoing care 1 15 []  - 0 Complex (extensive) Patient / Family Education for ongoing care X- 1 10 Staff obtains Programmer, systems, Records, Test Results / Process Orders []  - 0 Staff telephones HHA, Nursing Homes / Clarify orders / etc []  - 0 Routine Transfer to another Facility (non-emergent condition) []  - 0 Routine Hospital Admission (non-emergent condition) []  - 0 New Admissions / Biomedical engineer / Ordering NPWT, Apligraf, etc. []  - 0 Emergency Hospital Admission (emergent condition) X- 1 10 Simple Discharge Coordination []  - 0 Complex (extensive) Discharge Coordination PROCESS - Special Needs []  - Pediatric / Minor Patient Management 0 []  - 0 Isolation Patient Management []  - 0 Hearing / Language / Visual special needs []  - 0 Assessment of Community assistance (transportation, D/C planning, etc.) []  - 0 Additional assistance / Altered  mentation []  - 0 Support Surface(s) Assessment (bed, cushion, seat, etc.) INTERVENTIONS - Wound Cleansing / Measurement Bowring, Santina C. ([id number]) X- 1 5 Simple Wound Cleansing - one wound []  -  0 Complex Wound Cleansing - multiple wounds X- 1 5 Wound Imaging (photographs - any number of wounds) []  - 0 Wound Tracing (instead of photographs) X- 1 5 Simple Wound Measurement - one wound []  - 0 Complex Wound Measurement - multiple wounds INTERVENTIONS - Wound Dressings X - Small Wound Dressing one or multiple wounds 1 10 []  - 0 Medium Wound Dressing one or multiple wounds []  - 0 Large Wound Dressing one or multiple wounds []  - 0 Application of Medications - topical []  - 0 Application of Medications - injection INTERVENTIONS - Miscellaneous []  - External ear exam 0 []  - 0 Specimen Collection (cultures, biopsies, blood, body fluids, etc.) []  - 0 Specimen(s) / Culture(s) sent or taken to Lab for analysis []  - 0 Patient Transfer (multiple staff / Civil Service fast streamer / Similar devices) []  - 0 Simple Staple / Suture removal (25 or less) []  - 0 Complex Staple / Suture removal (26 or more) []  - 0 Hypo / Hyperglycemic Management (close monitor of Blood Glucose) []  - 0 Ankle / Brachial Index (ABI) - do not check if billed separately X- 1 5 Vital Signs Has the patient been seen at the hospital within the last three years: Yes Total Score: 90 Level Of Care: New/Established - Level 3 Electronic Signature(s) Signed: 08/03/2019 1:10:16 PM By: Montey Hora Entered By: Montey Hora on 08/03/2019 13:09:30 Peters, Jeannelle Loletha Grayer ([id number]) -------------------------------------------------------------------------------- Encounter Discharge Information Details Patient Name: Danielle Dess, Chessica C. Date of Service: 08/03/2019 12:15 PM Medical Record Number: [id number] Patient Account Number: [id number] Date of Birth/Sex: 1946/01/15 (74 y.o. F) Treating RN: Montey Hora Primary Care Willene Holian:  Domenick Gong Other Clinician: Referring Kaleigha Chamberlin: Domenick Gong Treating Curtis Uriarte/Extender: Melburn Hake, HOYT Weeks in Treatment: 2 Encounter Discharge Information Items Discharge Condition: Stable Ambulatory Status: Ambulatory Discharge Destination: Home Transportation: Private Auto Accompanied By: self Schedule Follow-up Appointment: Yes Clinical Summary of Care: Electronic Signature(s) Signed: 08/03/2019 1:09:05 PM By: Montey Hora Entered By: Montey Hora on 08/03/2019 13:09:05 Lemmons, Ashyia C. ([id number]) -------------------------------------------------------------------------------- Wound Assessment Details Patient Name: Reth, Gustavo C. Date of Service: 08/03/2019 12:15 PM Medical Record Number: [id number] Patient Account Number: [id number] Date of Birth/Sex: May 15, 1945 (74 y.o. F) Treating RN: Montey Hora Primary Care Taina Landry: Domenick Gong Other Clinician: Referring Durinda Buzzelli: Domenick Gong Treating Witt Plitt/Extender: Melburn Hake, HOYT Weeks in Treatment: 2 Wound Status Wound Number: 1 Primary Etiology: Trauma, Other Wound Location: Right, Anterior Lower Leg Wound Status: Open Wounding Event: Trauma Comorbid History: Hypertension, Type II Diabetes Date Acquired: 06/20/2019 Weeks Of Treatment: 2 Clustered Wound: No Wound Measurements Length: (cm) 0.3 Width: (cm) 0.5 Depth: (cm) 0.4 Area: (cm) 0.118 Volume: (cm) 0.047 % Reduction in Area: 99.1% % Reduction in Volume: 96.4% Epithelialization: None Tunneling: No Undermining: Yes Starting Position (o'clock): 2 Ending Position (o'clock): 3 Maximum Distance: (cm) 0.8 Wound Description Classification: Full Thickness Without Exposed Support Structures Wound Margin: Flat and Intact Exudate Amount: Medium Exudate Type: Serosanguineous Exudate Color: red, brown Foul Odor After Cleansing: No Slough/Fibrino Yes Wound Bed Granulation Amount: Large (67-100%) Exposed Structure Granulation Quality:  Red Fascia Exposed: No Necrotic Amount: Small (1-33%) Fat Layer (Subcutaneous Tissue) Exposed: Yes Necrotic Quality: Adherent Slough Tendon Exposed: No Muscle Exposed: No Joint Exposed: No Bone Exposed: No Treatment Notes Wound #1 (Right, Anterior Lower Leg) Notes iodoform, scell, abd, conform and tubigrip f Electronic Signature(s) Signed: 08/03/2019 1:05:03 PM By: Montey Hora Entered By: Montey Hora on 08/03/2019 13:05:03

## 2019-08-06 ENCOUNTER — Other Ambulatory Visit: Payer: Self-pay

## 2019-08-06 ENCOUNTER — Encounter: Payer: Medicare Other | Admitting: Physician Assistant

## 2019-08-06 DIAGNOSIS — Z79899 Other long term (current) drug therapy: Secondary | ICD-10-CM | POA: Diagnosis not present

## 2019-08-06 DIAGNOSIS — L97812 Non-pressure chronic ulcer of other part of right lower leg with fat layer exposed: Secondary | ICD-10-CM | POA: Diagnosis not present

## 2019-08-06 DIAGNOSIS — E11622 Type 2 diabetes mellitus with other skin ulcer: Secondary | ICD-10-CM | POA: Diagnosis not present

## 2019-08-06 DIAGNOSIS — I10 Essential (primary) hypertension: Secondary | ICD-10-CM | POA: Diagnosis not present

## 2019-08-06 DIAGNOSIS — S81801A Unspecified open wound, right lower leg, initial encounter: Secondary | ICD-10-CM | POA: Diagnosis not present

## 2019-08-06 NOTE — Progress Notes (Signed)
[name], [name] ([id number]) Visit Report for 08/06/2019 Arrival Information Details Patient Name: [name], [name] Date of Service: 08/06/2019 7:30 AM Medical Record Number: [id number] Patient Account Number: [id number] Date of Birth/Sex: Nov 23, 1945 (74 y.o. F) Treating RN: [name] Primary Care [name] Other Clinician: Referring Adalene Gulotta: Domenick Gong Treating Donterius Filley/Extender: Melburn [name], [name] Weeks in Treatment: 2 Visit Information History Since Last Visit Added or deleted any medications: No Patient Arrived: Ambulatory Any new allergies or adverse reactions: No Arrival Time: 07:40 Had a fall or experienced change in No Accompanied By: self activities of daily living that may affect Transfer Assistance: None risk of falls: Patient Identification Verified: Yes Signs or symptoms of abuse/neglect since last visito No Secondary Verification Process Completed: Yes Hospitalized since last visit: No Patient Has Alerts: Yes Implantable device outside of the clinic excluding No Patient Alerts: DMII cellular tissue based products placed in the center since last visit: Has Dressing in Place as Prescribed: Yes Pain Present Now: No Electronic Signature(s) Signed: 08/06/2019 11:04:59 AM By: Lorine Bears RCP, RRT, CHT Entered By: Lorine Bears on 08/06/2019 07:41:13 [name], [name]. ([id number]) -------------------------------------------------------------------------------- Clinic Level of Care Assessment Details Patient Name: [name], [name] Date of Service: 08/06/2019 7:30 AM Medical Record Number: [id number] Patient Account Number: [id number] Date of Birth/Sex: [birth year] (74 y.o. F) Treating RN: [name] Primary Care Ole Lafon: Domenick Gong Other Clinician: Referring Tico Crotteau: Domenick Gong Treating Rodert Hinch/Extender: Melburn [name], [name] Weeks in Treatment: 2 Clinic Level of Care Assessment Items TOOL 4 Quantity  Score []  - Use when only an EandM is performed on FOLLOW-UP visit 0 ASSESSMENTS - Nursing Assessment / Reassessment X - Reassessment of Co-morbidities (includes updates in patient status) 1 10 X- 1 5 Reassessment of Adherence to Treatment Plan ASSESSMENTS - Wound and Skin Assessment / Reassessment X - Simple Wound Assessment / Reassessment - one wound 1 5 []  - 0 Complex Wound Assessment / Reassessment - multiple wounds []  - 0 Dermatologic / Skin Assessment (not related to wound area) ASSESSMENTS - Focused Assessment []  - Circumferential Edema Measurements - multi extremities 0 []  - 0 Nutritional Assessment / Counseling / Intervention []  - 0 Lower Extremity Assessment (monofilament, tuning fork, pulses) []  - 0 Peripheral Arterial Disease Assessment (using hand held doppler) ASSESSMENTS - Ostomy and/or Continence Assessment and Care []  - Incontinence Assessment and Management 0 []  - 0 Ostomy Care Assessment and Management (repouching, etc.) PROCESS - Coordination of Care X - Simple Patient / Family Education for ongoing care 1 15 []  - 0 Complex (extensive) Patient / Family Education for ongoing care []  - 0 Staff obtains Programmer, systems, Records, Test Results / Process Orders []  - 0 Staff telephones HHA, Nursing Homes / Clarify orders / etc []  - 0 Routine Transfer to another Facility (non-emergent condition) []  - 0 Routine Hospital Admission (non-emergent condition) []  - 0 New Admissions / Biomedical engineer / Ordering NPWT, Apligraf, etc. []  - 0 Emergency Hospital Admission (emergent condition) X- 1 10 Simple Discharge Coordination []  - 0 Complex (extensive) Discharge Coordination PROCESS - Special Needs []  - Pediatric / Minor Patient Management 0 []  - 0 Isolation Patient Management []  - 0 Hearing / Language / Visual special needs []  - 0 Assessment of Community assistance (transportation, D/C planning, etc.) []  - 0 Additional assistance / Altered mentation []  -  0 Support Surface(s) Assessment (bed, cushion, seat, etc.) INTERVENTIONS - Wound Cleansing / Measurement [name], [name] ([id number]) X- 1 5 Simple Wound Cleansing - one wound []  - 0  Complex Wound Cleansing - multiple wounds X- 1 5 Wound Imaging (photographs - any number of wounds) []  - 0 Wound Tracing (instead of photographs) X- 1 5 Simple Wound Measurement - one wound []  - 0 Complex Wound Measurement - multiple wounds INTERVENTIONS - Wound Dressings []  - Small Wound Dressing one or multiple wounds 0 X- 1 15 Medium Wound Dressing one or multiple wounds []  - 0 Large Wound Dressing one or multiple wounds []  - 0 Application of Medications - topical []  - 0 Application of Medications - injection INTERVENTIONS - Miscellaneous []  - External ear exam 0 []  - 0 Specimen Collection (cultures, biopsies, blood, body fluids, etc.) []  - 0 Specimen(s) / Culture(s) sent or taken to Lab for analysis []  - 0 Patient Transfer (multiple staff / Civil Service fast streamer / Similar devices) []  - 0 Simple Staple / Suture removal (25 or less) []  - 0 Complex Staple / Suture removal (26 or more) []  - 0 Hypo / Hyperglycemic Management (close monitor of Blood Glucose) []  - 0 Ankle / Brachial Index (ABI) - do not check if billed separately X- 1 5 Vital Signs Has the patient been seen at the hospital within the last three years: Yes Total Score: 80 Level Of Care: New/Established - Level 3 Electronic Signature(s) Signed: 08/06/2019 12:17:42 PM By: [name] Entered By: [name] on 08/06/2019 [name], [name] -------------------------------------------------------------------------------- Encounter Discharge Information Details Patient Name: [name], [name] Date of Service: 08/06/2019 7:30 AM Medical Record Number: [id number] Patient Account Number: [id number] Date of Birth/Sex: 1945/11/26 (74 y.o. F) Treating RN: [name] Primary Care Rital Cavey: Domenick Gong Other  Clinician: Referring Daysie Helf: Domenick Gong Treating Chalonda Schlatter/Extender: Melburn [name], [name] Weeks in Treatment: 2 Encounter Discharge Information Items Discharge Condition: Stable Ambulatory Status: Ambulatory Discharge Destination: Home Transportation: Private Auto Accompanied By: self Schedule Follow-up Appointment: Yes Clinical Summary of Care: Electronic Signature(s) Signed: 08/06/2019 12:17:42 PM By: [name] Entered By: [name] on 08/06/2019 08:06:50 [name], [name] ([id number]) -------------------------------------------------------------------------------- Lower Extremity Assessment Details Patient Name: [name], [name] Date of Service: 08/06/2019 7:30 AM Medical Record Number: [id number] Patient Account Number: [id number] Date of Birth/Sex: Apr 24, 1945 (74 y.o. F) Treating RN: [name] Primary Care Yeudiel Mateo: Domenick Gong Other Clinician: Referring Lova Urbieta: Domenick Gong Treating Ender Rorke/Extender: STONE III, [name] Weeks in Treatment: 2 Edema Assessment Assessed: [Left: No] [Right: No] Edema: [Left: N] [Right: o] Vascular Assessment Pulses: Dorsalis Pedis Palpable: [Right:Yes] Electronic Signature(s) Signed: 08/06/2019 12:17:42 PM By: [name] Entered By: [name] on 08/06/2019 07:48:26 [place], [place] ([id number]) -------------------------------------------------------------------------------- Multi Wound Chart Details Patient Name: [name], [name] Date of Service: 08/06/2019 7:30 AM Medical Record Number: [id number] Patient Account Number: [id number] Date of Birth/Sex: 04/18/1945 (74 y.o. F) Treating RN: [name] Primary Care Renlee Floor: Domenick Gong Other Clinician: Referring Dreyton Roessner: Domenick Gong Treating Garyn Waguespack/Extender: Melburn [name], [name] Weeks in Treatment: 2 Vital Signs Height(in): 59 Pulse(bpm): 61 Weight(lbs): 178 Blood Pressure(mmHg): 146/53 Body Mass Index(BMI): 36 Temperature(F): 98.1 Respiratory  Rate(breaths/min): 16 Photos: [N/A:N/A] Wound Location: Right, Anterior Lower Leg N/A N/A Wounding Event: Trauma N/A N/A Primary Etiology: Trauma, Other N/A N/A Comorbid History: Hypertension, Type II Diabetes N/A N/A Date Acquired: 06/20/2019 N/A N/A Weeks of Treatment: 2 N/A N/A Wound Status: Open N/A N/A Measurements L x W x D (cm) 0.2x0.2x0.2 N/A N/A Area (cm) : 0.031 N/A N/A Volume (cm) : 0.006 N/A N/A % Reduction in Area: 99.80% N/A N/A % Reduction in Volume: 99.50% N/A N/A Starting Position 1 (o'clock): 1 Ending Position 1 (o'clock):  4 Maximum Distance 1 (cm): 0.7 Undermining: Yes N/A N/A Classification: Full Thickness Without Exposed N/A N/A Support Structures Exudate Amount: Medium N/A N/A Exudate Type: Serosanguineous N/A N/A Exudate Color: red, brown N/A N/A Wound Margin: Flat and Intact N/A N/A Granulation Amount: Large (67-100%) N/A N/A Granulation Quality: Red N/A N/A Necrotic Amount: Small (1-33%) N/A N/A Exposed Structures: Fat Layer (Subcutaneous Tissue) N/A N/A Exposed: Yes Fascia: No Tendon: No Muscle: No Joint: No Bone: No Epithelialization: None N/A N/A Treatment Notes Electronic Signature(s) Signed: 08/06/2019 12:17:42 PM By: [name] Entered By: [name] on 08/06/2019 07:59:00 [name], [name] ([id number]) [place], [place]. ([id number]) -------------------------------------------------------------------------------- Villas Details Patient Name: [name], Allyne C. Date of Service: 08/06/2019 7:30 AM Medical Record Number: [id number] Patient Account Number: [id number] Date of Birth/Sex: 08-Nov-1945 (74 y.o. F) Treating RN: [name] Primary Care Iysha Mishkin: Domenick Gong Other Clinician: Referring Gurleen Larrivee: Domenick Gong Treating Barlow Harrison/Extender: Melburn [name], [name] Weeks in Treatment: 2 Active Inactive Abuse / Safety / Falls / Self Care Management Nursing Diagnoses: Potential for falls Goals: Patient will not  experience any injury related to falls Date Initiated: 07/17/2019 Target Resolution Date: 10/17/2019 Goal Status: Active Interventions: Assess fall risk on admission and as needed Notes: Necrotic Tissue Nursing Diagnoses: Impaired tissue integrity related to necrotic/devitalized tissue Goals: Necrotic/devitalized tissue will be minimized in the wound bed Date Initiated: 07/17/2019 Target Resolution Date: 10/17/2019 Goal Status: Active Interventions: Provide education on necrotic tissue and debridement process Notes: Orientation to the Wound Care Program Nursing Diagnoses: Knowledge deficit related to the wound healing center program Goals: Patient/caregiver will verbalize understanding of the [facility] Date Initiated: 07/17/2019 Target Resolution Date: 10/17/2019 Goal Status: Active Interventions: Provide education on orientation to the wound center Notes: Wound/Skin Impairment Nursing Diagnoses: Impaired tissue integrity Goals: Ulcer/skin breakdown will heal within 14 weeks Date Initiated: 07/17/2019 Target Resolution Date: 10/17/2019 Goal Status: Active [name], [name] Interventions: Assess patient/caregiver ability to obtain necessary supplies Assess patient/caregiver ability to perform ulcer/skin care regimen upon admission and as needed Assess ulceration(s) every visit Notes: Electronic Signature(s) Signed: 08/06/2019 12:17:42 PM By: [name] Entered By: [name] on 08/06/2019 07:58:48 [name], [name] ([id number]) -------------------------------------------------------------------------------- Pain Assessment Details Patient Name: [name], Shantella C. Date of Service: 08/06/2019 7:30 AM Medical Record Number: [id number] Patient Account Number: [id number] Date of Birth/Sex: Oct 02, 1945 (74 y.o. F) Treating RN: [name] Primary Care Lyndal Alamillo: Domenick Gong Other Clinician: Referring Maribel Luis: Domenick Gong Treating  Tierrah Anastos/Extender: Melburn [name], [name] Weeks in Treatment: 2 Active Problems Location of Pain Severity and Description of Pain Patient Has Paino No Site Locations Pain Management and Medication Current Pain Management: Electronic Signature(s) Signed: 08/06/2019 12:17:42 PM By: [name] Entered By: [name] on 08/06/2019 07:47:06 [name], [name] ([id number]) -------------------------------------------------------------------------------- Patient/Caregiver Education Details Patient Name: [name], Shakema C. Date of Service: 08/06/2019 7:30 AM Medical Record Number: [id number] Patient Account Number: [id number] Date of Birth/Gender: 02/27/1946 (74 y.o. F) Treating RN: [name] Primary Care Physician: Domenick Gong Other Clinician: Referring Physician: Domenick Gong Treating Physician/Extender: Sharalyn Ink in Treatment: 2 Education Assessment Education Provided To: Patient Education Topics Provided Wound/Skin Impairment: Handouts: Caring for Your Ulcer Methods: Demonstration, Explain/Verbal Responses: State content correctly Electronic Signature(s) Signed: 08/06/2019 12:17:42 PM By: [name] Entered By: [name] on 08/06/2019 08:06:15 [place], [place]. ([id number]) -------------------------------------------------------------------------------- Wound Assessment Details Patient Name: [name], Jozelynn C. Date of Service: 08/06/2019 7:30 AM Medical Record Number: [id number] Patient Account Number: [id number] Date of Birth/Sex: 1945-11-10 (74 y.o. F)  Treating RN: [name] Primary Care Kalik Hoare: Domenick Gong Other Clinician: Referring Treana Lacour: Domenick Gong Treating Kyshon Tolliver/Extender: Melburn [name], [name] Weeks in Treatment: 2 Wound Status Wound Number: 1 Primary Etiology: Trauma, Other Wound Location: Right, Anterior Lower Leg Wound Status: Open Wounding Event: Trauma Comorbid History: Hypertension, Type II Diabetes Date Acquired:  06/20/2019 Weeks Of Treatment: 2 Clustered Wound: No Photos Wound Measurements Length: (cm) 0.2 Width: (cm) 0.2 Depth: (cm) 0.2 Area: (cm) 0.031 Volume: (cm) 0.006 % Reduction in Area: 99.8% % Reduction in Volume: 99.5% Epithelialization: None Tunneling: No Undermining: Yes Starting Position (o'clock): 1 Ending Position (o'clock): 4 Maximum Distance: (cm) 0.7 Wound Description Classification: Full Thickness Without Exposed Support Structures Wound Margin: Flat and Intact Exudate Amount: Medium Exudate Type: Serosanguineous Exudate Color: red, brown Foul Odor After Cleansing: No Slough/Fibrino Yes Wound Bed Granulation Amount: Large (67-100%) Exposed Structure Granulation Quality: Red Fascia Exposed: No Necrotic Amount: Small (1-33%) Fat Layer (Subcutaneous Tissue) Exposed: Yes Necrotic Quality: Adherent Slough Tendon Exposed: No Muscle Exposed: No Joint Exposed: No Bone Exposed: No Treatment Notes Wound #1 (Right, Anterior Lower Leg) Sudberry, Isidra C. ([id number]) Notes prisma, abd, conform and tubigrip f Electronic Signature(s) Signed: 08/06/2019 12:17:42 PM By: [name] Entered By: [name] on 08/06/2019 07:48:10 Ribaudo, Nou C. ([id number]) -------------------------------------------------------------------------------- Vitals Details Patient Name: [name], Amberia C. Date of Service: 08/06/2019 7:30 AM Medical Record Number: [id number] Patient Account Number: [id number] Date of Birth/Sex: [birth year] (74 y.o. F) Treating RN: [name] Primary Care Tanaysha Alkins: Domenick Gong Other Clinician: Referring Violett Hobbs: Domenick Gong Treating Armari Fussell/Extender: Melburn [name], [name] Weeks in Treatment: 2 Vital Signs Time Taken: 07:35 Temperature (F): 98.1 Height (in): 59 Pulse (bpm): 61 Weight (lbs): 178 Respiratory Rate (breaths/min): 16 Body Mass Index (BMI): 35.9 Blood Pressure (mmHg): 146/53 Reference Range: 80 - 120 mg / dl Electronic  Signature(s) Signed: 08/06/2019 11:04:59 AM By: Lorine Bears RCP, RRT, CHT Entered By: Lorine Bears on 08/06/2019 07:43:29

## 2019-08-06 NOTE — Progress Notes (Signed)
SHAWNDEL, SENESAC ([id number]) Visit Report for 08/06/2019 Chief Complaint Document Details Patient Name: [name], [name] Date of Service: 08/06/2019 7:30 AM Medical Record Number: [id number] Patient Account Number: [id number] Date of Birth/Sex: 1945-03-24 (74 y.o. F) Treating RN: Army Melia Primary Care Provider: Domenick Gong Other Clinician: Referring Provider: Domenick Gong Treating Provider/Extender: Melburn Hake, Cherylynn Liszewski Weeks in Treatment: 2 Information Obtained from: Patient Chief Complaint Right LE Ulcer secondary to trauma Electronic Signature(s) Signed: 08/06/2019 12:49:03 PM By: Worthy Keeler PA-C Entered By: Worthy Keeler on 08/06/2019 07:51:35 [name], [name] ([id number]) -------------------------------------------------------------------------------- HPI Details Patient Name: [name], [name] Date of Service: 08/06/2019 7:30 AM Medical Record Number: [id number] Patient Account Number: [id number] Date of Birth/Sex: 1945-05-09 (74 y.o. F) Treating RN: Army Melia Primary Care Provider: Domenick Gong Other Clinician: Referring Provider: Domenick Gong Treating Provider/Extender: Melburn Hake, Javionna Leder Weeks in Treatment: 2 History of Present Illness HPI Description: 07/17/2019 upon evaluation today patient presents for initial evaluation here in our clinic concerning issues with her right lower extremity. She subsequently sustained an injury on April 10 when she dropped something onto her leg causing a skin tear/hematoma. She was seen in urgent care where this was sutured and then subsequently the sutures are still in place. She was also given doxycycline about 10 days ago but fortunately seems to be doing a lot better in that regard as far as any infection is concerned I think the doxycycline is done well. There does not appear to be any signs of active infection which is good news. No fevers, chills, nausea, vomiting, or diarrhea. The patient does have a history of  diabetes and high blood pressure but otherwise is fairly healthy. She does have some lower extremity edema and she also does have psoriasis. 5/13; patient was in for a nurse visit on [name]. She now has 3 small open areas on the upper right tibial area. These do not interconnect however there is a wide amount of undermining from the larger open area medially. Serosanguineous drainage. She has only been in a Tubigrip I think this is because of the underlying psoriasis she needs to be able to lotion the skin on her legs. 5/20; I saw the patient last week with 3 small areas on the upper right tibia which was wound area initially caused by trauma. 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 CULTURE I did last time showed Enterococcus as well as Staphylococcus lugdunensis. The latter is a coag negative staph but is sometimes pathogenic rather than a skin contaminant. The Enterococcus would have not been reliably covered by Bactrim. I therefore put her on linezolid for 5 days. She is not tolerating this well with nausea and loose stools however she is taking it with food and seems to be tolerating enough to complete the course. Fortunately today the wound left bed looks better we have been using silver alginate 08/06/19 on evaluation today patient actually appears to be doing quite well with regard to her wound. We have been packing this with a small packing strip. With that being said I think at this point we likely may want to switch away from doing the packing strip and just utilize some collagen as I think we may be keeping it more open and we want to actually allow this a chance to try to close much more effectively. It is gotten very small compared to when I last saw the wound and overall the patient states she is not having  any pain which is great news. Electronic Signature(s) Signed: 08/06/2019 9:08:46 AM By: Worthy Keeler PA-C Entered By: Worthy Keeler  on 08/06/2019 09:08:46 [name], [name] ([id number]) -------------------------------------------------------------------------------- Physical Exam Details Patient Name: [name], [name] Date of Service: 08/06/2019 7:30 AM Medical Record Number: [id number] Patient Account Number: [id number] Date of Birth/Sex: August 27, 1945 (74 y.o. F) Treating RN: Army Melia Primary Care Provider: Domenick Gong Other Clinician: Referring Provider: Domenick Gong Treating Provider/Extender: STONE III, Carnella Fryman Weeks in Treatment: 2 Constitutional Well-nourished and well-hydrated in no acute distress. Respiratory normal breathing without difficulty. Psychiatric this patient is able to make decisions and demonstrates good insight into disease process. Alert and Oriented x 3. pleasant and cooperative. Notes Upon inspection patient's wound bed again showed signs of good granulation she does still have a little bit of undermining here that was noted but again I think that this is such a small area that any packing strip we attempt to put and may be actually keeping this to open at this point. At least would like to give a trial over the next week of collagen to see if this could be of benefit for her. I contemplated going with endoform as well that could be an option in the future but I would prefer the collagen I think at this point. Electronic Signature(s) Signed: 08/06/2019 9:09:25 AM By: Worthy Keeler PA-C Entered By: Worthy Keeler on 08/06/2019 09:09:24 [name] -------------------------------------------------------------------------------- Physician Orders Details Patient Name: [name], [name] Date of Service: 08/06/2019 7:30 AM Medical Record Number: [id number] Patient Account Number: [id number] Date of Birth/Sex: [birth year] (74 y.o. F) Treating RN: Army Melia Primary Care Provider: Domenick Gong Other Clinician: Referring Provider: Domenick Gong Treating  Provider/Extender: Melburn Hake, Sherilee Smotherman Weeks in Treatment: 2 Verbal / Phone Orders: No Diagnosis Coding ICD-10 Coding Code Description S81.801A Unspecified open wound, right lower leg, initial encounter E11.622 Type 2 diabetes mellitus with other skin ulcer I10 Essential (primary) hypertension Wound Cleansing Wound #1 Right,Anterior Lower Leg o Clean wound with Normal Saline. Primary Wound Dressing Wound #1 Right,Anterior Lower Leg o Silver Collagen - moisten with normal saline Secondary Dressing Wound #1 Right,Anterior Lower Leg o ABD and Kerlix/Conform Dressing Change Frequency Wound #1 Right,Anterior Lower Leg o Other: - twice weekly Follow-up Appointments o Return Appointment in 2 weeks. Edema Control Wound #1 Right,Anterior Lower Leg o Elevate legs to the level of the heart and pump ankles as often as possible o Other: - TubiGrip F Electronic Signature(s) Signed: 08/06/2019 12:17:42 PM By: Army Melia Signed: 08/06/2019 12:49:03 PM By: Worthy Keeler PA-C Entered By: Army Melia on 08/06/2019 [name], [name] ([id number]) -------------------------------------------------------------------------------- Problem List Details Patient Name: [name], [name] Date of Service: 08/06/2019 7:30 AM Medical Record Number: [id number] Patient Account Number: [id number] Date of Birth/Sex: 1945/05/03 (74 y.o. F) Treating RN: Army Melia Primary Care Provider: Domenick Gong Other Clinician: Referring Provider: Domenick Gong Treating Provider/Extender: Melburn Hake, Taaj Hurlbut Weeks in Treatment: 2 Active Problems ICD-10 Encounter Code Description Active Date MDM Diagnosis S81.801A Unspecified open wound, right lower leg, initial encounter 07/17/2019 No Yes E11.622 Type 2 diabetes mellitus with other skin ulcer 07/17/2019 No Yes I10 Essential (primary) hypertension 07/17/2019 No Yes Inactive Problems Resolved Problems Electronic Signature(s) Signed: 08/06/2019  12:49:03 PM By: Worthy Keeler PA-C Entered By: Worthy Keeler on 08/06/2019 07:51:16 [name], [name] ([id number]) -------------------------------------------------------------------------------- Progress Note Details Patient Name: [name], [name] Date of Service: 08/06/2019 7:30 AM Medical Record Number: [id number] Patient Account Number: [id number]  Date of Birth/Sex: Apr 18, 1945 (74 y.o. F) Treating RN: Army Melia Primary Care Provider: Domenick Gong Other Clinician: Referring Provider: Domenick Gong Treating Provider/Extender: Melburn Hake, Christabella Alvira Weeks in Treatment: 2 Subjective Chief Complaint Information obtained from Patient Right LE Ulcer secondary to trauma History of Present Illness (HPI) 07/17/2019 upon evaluation today patient presents for initial evaluation here in our clinic concerning issues with her right lower extremity. She subsequently sustained an injury on April 10 when she dropped something onto her leg causing a skin tear/hematoma. She was seen in urgent care where this was sutured and then subsequently the sutures are still in place. She was also given doxycycline about 10 days ago but fortunately seems to be doing a lot better in that regard as far as any infection is concerned I think the doxycycline is done well. There does not appear to be any signs of active infection which is good news. No fevers, chills, nausea, vomiting, or diarrhea. The patient does have a history of diabetes and high blood pressure but otherwise is fairly healthy. She does have some lower extremity edema and she also does have psoriasis. 5/13; patient was in for a nurse visit on [name]. She now has 3 small open areas on the upper right tibial area. These do not interconnect however there is a wide amount of undermining from the larger open area medially. Serosanguineous drainage. She has only been in a Tubigrip I think this is because of the underlying psoriasis she needs to be able to  lotion the skin on her legs. 5/20; I saw the patient last week with 3 small areas on the upper right tibia which was wound area initially caused by trauma. 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 CULTURE I did last time showed Enterococcus as well as Staphylococcus lugdunensis. The latter is a coag negative staph but is sometimes pathogenic rather than a skin contaminant. The Enterococcus would have not been reliably covered by Bactrim. I therefore put her on linezolid for 5 days. She is not tolerating this well with nausea and loose stools however she is taking it with food and seems to be tolerating enough to complete the course. Fortunately today the wound left bed looks better we have been using silver alginate 08/06/19 on evaluation today patient actually appears to be doing quite well with regard to her wound. We have been packing this with a small packing strip. With that being said I think at this point we likely may want to switch away from doing the packing strip and just utilize some collagen as I think we may be keeping it more open and we want to actually allow this a chance to try to close much more effectively. It is gotten very small compared to when I last saw the wound and overall the patient states she is not having any pain which is great news. Objective Constitutional Well-nourished and well-hydrated in no acute distress. Vitals Time Taken: 7:35 AM, Height: 59 in, Weight: 178 lbs, BMI: 35.9, Temperature: 98.1 F, Pulse: 61 bpm, Respiratory Rate: 16 breaths/min, Blood Pressure: 146/53 mmHg. Respiratory normal breathing without difficulty. Psychiatric this patient is able to make decisions and demonstrates good insight into disease process. Alert and Oriented x 3. pleasant and cooperative. General Notes: Upon inspection patient's wound bed again showed signs of good granulation she does still have a little bit of undermining  here that was noted but again I think that this is such a small  area that any packing strip we attempt to put and may be actually keeping this to open at this point. At least would like to give a trial over the next week of collagen to see if this could be of benefit for her. I contemplated going with endoform as well that could be an option in the future but I would prefer the collagen I think at this point. Integumentary (Hair, Skin) Wound #1 status is Open. Original cause of wound was Trauma. The wound is located on the Right,Anterior Lower Leg. The wound measures 0.2cm length x 0.2cm width x 0.2cm depth; 0.031cm^2 area and 0.006cm^3 volume. There is Fat Layer (Subcutaneous Tissue) Exposed exposed. There is no tunneling noted, however, there is undermining starting at 1:00 and ending at 4:00 with a maximum distance of 0.7cm. There is a medium amount of serosanguineous drainage noted. The wound margin is flat and intact. There is large (67-100%) red granulation [name], [name] ([id number]) within the wound bed. There is a small (1-33%) amount of necrotic tissue within the wound bed including Adherent Slough. Assessment Active Problems ICD-10 Unspecified open wound, right lower leg, initial encounter Type 2 diabetes mellitus with other skin ulcer Essential (primary) hypertension Plan Wound Cleansing: Wound #1 Right,Anterior Lower Leg: Clean wound with Normal Saline. Primary Wound Dressing: Wound #1 Right,Anterior Lower Leg: Silver Collagen - moisten with normal saline Secondary Dressing: Wound #1 Right,Anterior Lower Leg: ABD and Kerlix/Conform Dressing Change Frequency: Wound #1 Right,Anterior Lower Leg: Other: - twice weekly Follow-up Appointments: Return Appointment in 2 weeks. Edema Control: Wound #1 Right,Anterior Lower Leg: Elevate legs to the level of the heart and pump ankles as often as possible Other: - TubiGrip F 1 we can switch to silver collagen dressing which I  think will be appropriate for the patient. 2. I am also can recommend at this time that we continue to monitor for any signs of worsening or infection though I think that she really is doing quite well right now we just need to try to get this area headed in the right direction as far as filling in from the bottom out with the small area that still open and undermining. Hopefully the collagen will be beneficial in that regard. 3. I am also going to suggest that the patient continue to monitor for any signs of increased pain which could be an indication of infection ensuing. We will see patient back for reevaluation in 1 week here in the clinic. If anything worsens or changes patient will contact our office for additional recommendations. Electronic Signature(s) Signed: 08/06/2019 9:10:09 AM By: Worthy Keeler PA-C Entered By: Worthy Keeler on 08/06/2019 09:10:09 [place], Kandiss C. ([id number]) -------------------------------------------------------------------------------- SuperBill Details Patient Name: [name], [name] Date of Service: 08/06/2019 Medical Record Number: [id number] Patient Account Number: [id number] Date of Birth/Sex: 1945-08-29 (74 y.o. F) Treating RN: Army Melia Primary Care Provider: Domenick Gong Other Clinician: Referring Provider: Domenick Gong Treating Provider/Extender: Melburn Hake, Tobenna Needs Weeks in Treatment: 2 Diagnosis Coding ICD-10 Codes Code Description S81.801A Unspecified open wound, right lower leg, initial encounter E11.622 Type 2 diabetes mellitus with other skin ulcer I10 Essential (primary) hypertension Facility Procedures CPT4 Code: [id number] Description: 99213 - WOUND CARE VISIT-LEV 3 EST PT Modifier: Quantity: 1 Physician Procedures CPT4 Code: [id number] Description: 99213 - WC PHYS LEVEL 3 - EST PT Modifier: Quantity: 1 CPT4 Code: Description: ICD-10 Diagnosis Description S81.801A Unspecified open wound, right lower leg, initial encounter  E11.622 Type 2 diabetes mellitus with other skin ulcer I10  Essential (primary) hypertension Modifier: Quantity: Electronic Signature(s) Signed: 08/06/2019 9:10:25 AM By: Worthy Keeler PA-C Entered By: Worthy Keeler on 08/06/2019 09:10:24

## 2019-08-13 DIAGNOSIS — Z79899 Other long term (current) drug therapy: Secondary | ICD-10-CM | POA: Diagnosis not present

## 2019-08-20 ENCOUNTER — Ambulatory Visit: Payer: Medicare Other | Admitting: Physician Assistant

## 2019-08-20 DIAGNOSIS — Z85820 Personal history of malignant melanoma of skin: Secondary | ICD-10-CM | POA: Diagnosis not present

## 2019-08-20 DIAGNOSIS — L720 Epidermal cyst: Secondary | ICD-10-CM | POA: Diagnosis not present

## 2019-08-21 ENCOUNTER — Encounter: Payer: Medicare Other | Attending: Physician Assistant | Admitting: Physician Assistant

## 2019-08-21 ENCOUNTER — Other Ambulatory Visit: Payer: Self-pay

## 2019-08-21 DIAGNOSIS — Z09 Encounter for follow-up examination after completed treatment for conditions other than malignant neoplasm: Secondary | ICD-10-CM | POA: Insufficient documentation

## 2019-08-21 DIAGNOSIS — E119 Type 2 diabetes mellitus without complications: Secondary | ICD-10-CM | POA: Insufficient documentation

## 2019-08-21 DIAGNOSIS — I10 Essential (primary) hypertension: Secondary | ICD-10-CM | POA: Insufficient documentation

## 2019-08-21 DIAGNOSIS — E1169 Type 2 diabetes mellitus with other specified complication: Secondary | ICD-10-CM | POA: Diagnosis not present

## 2019-08-21 DIAGNOSIS — Z872 Personal history of diseases of the skin and subcutaneous tissue: Secondary | ICD-10-CM | POA: Diagnosis not present

## 2019-08-21 DIAGNOSIS — S81801A Unspecified open wound, right lower leg, initial encounter: Secondary | ICD-10-CM | POA: Diagnosis not present

## 2019-08-21 NOTE — Progress Notes (Addendum)
[name] ([id number]) Visit Report for 08/21/2019 Chief Complaint Document Details Patient Name: [name], [name] Date of Service: 08/21/2019 9:00 AM Medical Record Number: [id number] Patient Account Number: [id number] Date of Birth/Sex: 15-Dec-1945 (74 y.o. F) Treating RN: Montey Hora Primary Care Provider: Domenick Gong Other Clinician: Referring Provider: Domenick Gong Treating Provider/Extender: [name] [name]: 5 Information Obtained from: Patient Chief Complaint Right LE Ulcer secondary to trauma Electronic Signature(s) Signed: 08/21/2019 9:18:31 AM By: [name] Keeler PA-C Entered By: [name] on 08/21/2019 09:18:31 [place], Maurice. ([id number]) -------------------------------------------------------------------------------- HPI Details Patient Name: [name], [name] Date of Service: 08/21/2019 9:00 AM Medical Record Number: [id number] Patient Account Number: [id number] Date of Birth/Sex: 08-27-1945 (74 y.o. F) Treating RN: Montey Hora Primary Care Provider: Domenick Gong Other Clinician: Referring Provider: Domenick Gong Treating Provider/Extender: [name] Hake, [name] Weeks in [name]: 5 History of Present Illness HPI Description: 07/17/2019 upon evaluation today patient presents for initial evaluation here in our clinic concerning issues with her right lower extremity. She subsequently sustained an injury on April 10 when she dropped something onto her leg causing a skin tear/hematoma. She was seen in urgent care where this was sutured and then subsequently the sutures are still in place. She was also given doxycycline about 10 days ago but fortunately seems to be doing a lot better in that regard as far as any infection is concerned I think the doxycycline is done well. There does not appear to be any signs of active infection which is good news. No fevers, chills, nausea, vomiting, or diarrhea. The patient does have a history of  diabetes and high blood pressure but otherwise is fairly healthy. She does have some lower extremity edema and she also does have psoriasis. 5/13; patient was in for a nurse visit on Monday. She now has 3 small open areas on the upper right tibial area. These do not interconnect however there is a wide amount of undermining from the larger open area medially. Serosanguineous drainage. She has only been in a Tubigrip I think this is because of the underlying psoriasis she needs to be able to lotion the skin on her legs. 5/20; I saw the patient last week with 3 small areas on the upper right tibia which was wound area initially caused by trauma. 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 CULTURE I did last time showed Enterococcus as well as Staphylococcus lugdunensis. The latter is a coag negative staph but is sometimes pathogenic rather than a skin contaminant. The Enterococcus would have not been reliably covered by Bactrim. I therefore put her on linezolid for 5 days. She is not tolerating this well with nausea and loose stools however she is taking it with food and seems to be tolerating enough to complete the course. Fortunately today the wound left bed looks better we have been using silver alginate 08/06/19 on evaluation today patient actually appears to be doing quite well with regard to her wound. We have been packing this with a small packing strip. With that being said I think at this point we likely may want to switch away from doing the packing strip and just utilize some collagen as I think we may be keeping it more open and we want to actually allow this a chance to try to close much more effectively. It is gotten very small compared to when I last saw the wound and overall the patient states she is not having  any pain which is great news. 08/21/2019 upon evaluation today patient actually appears to be doing excellent in regard to her wounds. She  has been tolerating the dressing changes and in fact her wound appears to be completely healed which is great news. There is no signs of active infection at this time. Electronic Signature(s) Signed: 08/21/2019 10:08:47 AM By: [name] Keeler PA-C Entered By: [name] on 08/21/2019 10:08:47 [name] ([id number]) -------------------------------------------------------------------------------- Physical Exam Details Patient Name: [name], [name] Date of Service: 08/21/2019 9:00 AM Medical Record Number: [id number] Patient Account Number: [id number] Date of Birth/Sex: Feb 18, 1946 (74 y.o. F) Treating RN: Montey Hora Primary Care Provider: Domenick Gong Other Clinician: Referring Provider: Domenick Gong Treating Provider/Extender: [name] Weeks in [name]: 5 Constitutional Well-nourished and well-hydrated in no acute distress. Respiratory normal breathing without difficulty. Psychiatric this patient is able to make decisions and demonstrates good insight into disease process. Alert and Oriented x 3. pleasant and cooperative. Notes Upon inspection patient's wound bed actually showed signs of excellent epithelization there is no evidence of anything open at this point I think she is completely healed. Electronic Signature(s) Signed: 08/21/2019 10:09:06 AM By: [name] Keeler PA-C Entered By: [name] on 08/21/2019 10:09:05 [name] ([id number]) -------------------------------------------------------------------------------- Physician Orders Details Patient Name: [name], [name] Date of Service: 08/21/2019 9:00 AM Medical Record Number: [id number] Patient Account Number: [id number] Date of Birth/Sex: 1945/07/14 (74 y.o. F) Treating RN: Montey Hora Primary Care Provider: Domenick Gong Other Clinician: Referring Provider: Domenick Gong Treating Provider/Extender: [name] Weeks in [name]: 5 Verbal / Phone Orders:  No Diagnosis Coding ICD-10 Coding Code Description S81.801A Unspecified open wound, right lower leg, initial encounter E11.622 Type 2 diabetes mellitus with other skin ulcer I10 Essential (primary) hypertension Discharge From [facility] Services o Discharge from [place] Signature(s) Signed: 08/21/2019 3:56:00 PM By: [name] Keeler PA-C Signed: 08/21/2019 3:58:18 PM By: Montey Hora Entered By: Montey Hora on 08/21/2019 09:20:55 Jutte, [name] ([id number]) -------------------------------------------------------------------------------- Problem List Details Patient Name: Langi, [name] Date of Service: 08/21/2019 9:00 AM Medical Record Number: [id number] Patient Account Number: [id number] Date of Birth/Sex: 1945-09-09 (74 y.o. F) Treating RN: Montey Hora Primary Care Provider: Domenick Gong Other Clinician: Referring Provider: Domenick Gong Treating Provider/Extender: [name] Hake, Tynetta Bachmann Weeks in [name]: 5 Active Problems ICD-10 Encounter Code Description Active Date MDM Diagnosis S81.801A Unspecified open wound, right lower leg, initial encounter 07/17/2019 No Yes E11.622 Type 2 diabetes mellitus with other skin ulcer 07/17/2019 No Yes I10 Essential (primary) hypertension 07/17/2019 No Yes Inactive Problems Resolved Problems Electronic Signature(s) Signed: 08/21/2019 9:18:25 AM By: [name] Keeler PA-C Entered By: [name] on 08/21/2019 09:18:25 Mayers, [name] ([id number]) -------------------------------------------------------------------------------- Progress Note Details Patient Name: [name], Leanette C. Date of Service: 08/21/2019 9:00 AM Medical Record Number: [id number] Patient Account Number: [id number] Date of Birth/Sex: [birth year] (74 y.o. F) Treating RN: Montey Hora Primary Care Provider: Domenick Gong Other Clinician: Referring Provider: Domenick Gong Treating Provider/Extender: [name] Hake, Nolon Yellin Weeks in [name]:  5 Subjective Chief Complaint Information obtained from Patient Right LE Ulcer secondary to trauma History of Present Illness (HPI) 07/17/2019 upon evaluation today patient presents for initial evaluation here in our clinic concerning issues with her right lower extremity. She subsequently sustained an injury on April 10 when she dropped something onto her leg causing a skin tear/hematoma. She was seen in urgent care where this was sutured and then subsequently the sutures are still in place. She was  also given doxycycline about 10 days ago but fortunately seems to be doing a lot better in that regard as far as any infection is concerned I think the doxycycline is done well. There does not appear to be any signs of active infection which is good news. No fevers, chills, nausea, vomiting, or diarrhea. The patient does have a history of diabetes and high blood pressure but otherwise is fairly healthy. She does have some lower extremity edema and she also does have psoriasis. 5/13; patient was in for a nurse visit on Monday. She now has 3 small open areas on the upper right tibial area. These do not interconnect however there is a wide amount of undermining from the larger open area medially. Serosanguineous drainage. She has only been in a Tubigrip I think this is because of the underlying psoriasis she needs to be able to lotion the skin on her legs. 5/20; I saw the patient last week with 3 small areas on the upper right tibia which was wound area initially caused by trauma. 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 CULTURE I did last time showed Enterococcus as well as Staphylococcus lugdunensis. The latter is a coag negative staph but is sometimes pathogenic rather than a skin contaminant. The Enterococcus would have not been reliably covered by Bactrim. I therefore put her on linezolid for 5 days. She is not tolerating this well with nausea and loose  stools however she is taking it with food and seems to be tolerating enough to complete the course. Fortunately today the wound left bed looks better we have been using silver alginate 08/06/19 on evaluation today patient actually appears to be doing quite well with regard to her wound. We have been packing this with a small packing strip. With that being said I think at this point we likely may want to switch away from doing the packing strip and just utilize some collagen as I think we may be keeping it more open and we want to actually allow this a chance to try to close much more effectively. It is gotten very small compared to when I last saw the wound and overall the patient states she is not having any pain which is great news. 08/21/2019 upon evaluation today patient actually appears to be doing excellent in regard to her wounds. She has been tolerating the dressing changes and in fact her wound appears to be completely healed which is great news. There is no signs of active infection at this time. Objective Constitutional Well-nourished and well-hydrated in no acute distress. Vitals Time Taken: 9:02 AM, Height: 59 in, Weight: 178 lbs, BMI: 35.9, Temperature: 97.8 F, Pulse: 55 bpm, Respiratory Rate: 16 breaths/min, Blood Pressure: 152/58 mmHg. Respiratory normal breathing without difficulty. Psychiatric this patient is able to make decisions and demonstrates good insight into disease process. Alert and Oriented x 3. pleasant and cooperative. General Notes: Upon inspection patient's wound bed actually showed signs of excellent epithelization there is no evidence of anything open at this point I think she is completely healed. Integumentary (Hair, Skin) Wound #1 status is Open. Original cause of wound was Trauma. The wound is located on the Right,Anterior Lower Leg. The wound measures 0cm length x 0cm width x 0cm depth; 0cm^2 area and 0cm^3 volume. The wound is limited to skin breakdown.  There is a none present amount of drainage noted. The wound margin is flat and intact. There is no granulation within the wound  bed. There is no necrotic tissue within the wound Holt, Koraline C. ([id number]) bed. Assessment Active Problems ICD-10 Unspecified open wound, right lower leg, initial encounter Type 2 diabetes mellitus with other skin ulcer Essential (primary) hypertension Plan Discharge From [facility] Services: Discharge from [place] [facility]. I would recommend currently that we discontinue wound care services as the patient is completely healed and has done excellent. We will see the patient back for follow-up visit as needed. Electronic Signature(s) Signed: 08/21/2019 10:12:28 AM By: [name] Keeler PA-C Entered By: [name] on 08/21/2019 10:12:27 Dickard, Brissia Loletha [name] ([id number]) -------------------------------------------------------------------------------- SuperBill Details Patient Name: [name], Jazzmon C. Date of Service: 08/21/2019 Medical Record Number: [id number] Patient Account Number: [id number] Date of Birth/Sex: [birth year] (74 y.o. F) Treating RN: Montey Hora Primary Care Provider: Domenick Gong Other Clinician: Referring Provider: Domenick Gong Treating Provider/Extender: [name] Hake, Thaer Miyoshi Weeks in [name]: 5 Diagnosis Coding ICD-10 Codes Code Description S81.801A Unspecified open wound, right lower leg, initial encounter E11.622 Type 2 diabetes mellitus with other skin ulcer I10 Essential (primary) hypertension Facility Procedures CPT4 Code: [id number] Description: 99213 - WOUND CARE VISIT-LEV 3 EST PT Modifier: Quantity: 1 Physician Procedures CPT4 Code: [id number] Description: 92763 - WC PHYS LEVEL 2 - EST PT Modifier: Quantity: 1 CPT4 Code: Description: ICD-10 Diagnosis Description S81.801A Unspecified open wound, right lower leg, initial encounter E11.622 Type 2 diabetes mellitus with other skin ulcer I10 Essential (primary)  hypertension Modifier: Quantity: Electronic Signature(s) Signed: 08/21/2019 10:12:46 AM By: [name] Keeler PA-C Entered By: [name] on 08/21/2019 10:12:46

## 2019-08-21 NOTE — Progress Notes (Signed)
[name], [name] ([id number]) Visit Report for 08/21/2019 Arrival Information Details Patient Name: [name], [name] Date of Service: 08/21/2019 9:00 AM Medical Record Number: [id number] Patient Account Number: [id number] Date of Birth/Sex: 1945/09/21 (74 y.o. F) Treating RN: Army Melia Primary Care Astoria Condon: Domenick Gong Other Clinician: Referring Geneen Dieter: Domenick Gong Treating Maesyn Frisinger/Extender: Melburn Hake, HOYT Weeks in Treatment: 5 Visit Information History Since Last Visit Added or deleted any medications: No Patient Arrived: Ambulatory Any new allergies or adverse reactions: No Arrival Time: 09:01 Had a fall or experienced change in No Accompanied By: self activities of daily living that may affect Transfer Assistance: None risk of falls: Patient Identification Verified: Yes Signs or symptoms of abuse/neglect since last visito No Patient Has Alerts: Yes Hospitalized since last visit: No Patient Alerts: DMII Has Dressing in Place as Prescribed: Yes Pain Present Now: No Electronic Signature(s) Signed: 08/21/2019 3:19:07 PM By: Army Melia Entered By: Army Melia on 08/21/2019 09:01:57 [name], NorcoMarland Kitchen ([id number]) -------------------------------------------------------------------------------- Clinic Level of Care Assessment Details Patient Name: [name], [name] Date of Service: 08/21/2019 9:00 AM Medical Record Number: [id number] Patient Account Number: [id number] Date of Birth/Sex: [birth year] (74 y.o. F) Treating RN: Montey Hora Primary Care Armya Westerhoff: Domenick Gong Other Clinician: Referring Alyssandra Hulsebus: Domenick Gong Treating Alwilda Gilland/Extender: Melburn Hake, HOYT Weeks in Treatment: 5 Clinic Level of Care Assessment Items TOOL 4 Quantity Score []  - Use when only an EandM is performed on FOLLOW-UP visit 0 ASSESSMENTS - Nursing Assessment / Reassessment X - Reassessment of Co-morbidities (includes updates in patient status) 1 10 X- 1 5 Reassessment of  Adherence to Treatment Plan ASSESSMENTS - Wound and Skin Assessment / Reassessment X - Simple Wound Assessment / Reassessment - one wound 1 5 []  - 0 Complex Wound Assessment / Reassessment - multiple wounds []  - 0 Dermatologic / Skin Assessment (not related to wound area) ASSESSMENTS - Focused Assessment []  - Circumferential Edema Measurements - multi extremities 0 []  - 0 Nutritional Assessment / Counseling / Intervention X- 1 5 Lower Extremity Assessment (monofilament, tuning fork, pulses) []  - 0 Peripheral Arterial Disease Assessment (using hand held doppler) ASSESSMENTS - Ostomy and/or Continence Assessment and Care []  - Incontinence Assessment and Management 0 []  - 0 Ostomy Care Assessment and Management (repouching, etc.) PROCESS - Coordination of Care X - Simple Patient / Family Education for ongoing care 1 15 []  - 0 Complex (extensive) Patient / Family Education for ongoing care X- 1 10 Staff obtains Programmer, systems, Records, Test Results / Process Orders []  - 0 Staff telephones HHA, Nursing Homes / Clarify orders / etc []  - 0 Routine Transfer to another Facility (non-emergent condition) []  - 0 Routine Hospital Admission (non-emergent condition) []  - 0 New Admissions / Biomedical engineer / Ordering NPWT, Apligraf, etc. []  - 0 Emergency Hospital Admission (emergent condition) X- 1 10 Simple Discharge Coordination []  - 0 Complex (extensive) Discharge Coordination PROCESS - Special Needs []  - Pediatric / Minor Patient Management 0 []  - 0 Isolation Patient Management []  - 0 Hearing / Language / Visual special needs []  - 0 Assessment of Community assistance (transportation, D/C planning, etc.) []  - 0 Additional assistance / Altered mentation []  - 0 Support Surface(s) Assessment (bed, cushion, seat, etc.) INTERVENTIONS - Wound Cleansing / Measurement [name], [name] ([id number]) X- 1 5 Simple Wound Cleansing - one wound []  - 0 Complex Wound Cleansing -  multiple wounds X- 1 5 Wound Imaging (photographs - any number of wounds) []  - 0 Wound Tracing (instead of photographs) X- 1 5 Simple  Wound Measurement - one wound []  - 0 Complex Wound Measurement - multiple wounds INTERVENTIONS - Wound Dressings []  - Small Wound Dressing one or multiple wounds 0 []  - 0 Medium Wound Dressing one or multiple wounds []  - 0 Large Wound Dressing one or multiple wounds []  - 0 Application of Medications - topical []  - 0 Application of Medications - injection INTERVENTIONS - Miscellaneous []  - External ear exam 0 []  - 0 Specimen Collection (cultures, biopsies, blood, body fluids, etc.) []  - 0 Specimen(s) / Culture(s) sent or taken to Lab for analysis []  - 0 Patient Transfer (multiple staff / Civil Service fast streamer / Similar devices) []  - 0 Simple Staple / Suture removal (25 or less) []  - 0 Complex Staple / Suture removal (26 or more) []  - 0 Hypo / Hyperglycemic Management (close monitor of Blood Glucose) []  - 0 Ankle / Brachial Index (ABI) - do not check if billed separately X- 1 5 Vital Signs Has the patient been seen at the hospital within the last three years: Yes Total Score: 80 Level Of Care: New/Established - Level 3 Electronic Signature(s) Signed: 08/21/2019 3:58:18 PM By: Montey Hora Entered By: Montey Hora on 08/21/2019 09:21:36 [name] ([id number]) -------------------------------------------------------------------------------- Encounter Discharge Information Details Patient Name: [name], [name] Date of Service: 08/21/2019 9:00 AM Medical Record Number: [id number] Patient Account Number: [id number] Date of Birth/Sex: Jan 25, 1946 (74 y.o. F) Treating RN: Montey Hora Primary Care Cordarro Spinnato: Domenick Gong Other Clinician: Referring Trichelle Lehan: Domenick Gong Treating Jesyca Weisenburger/Extender: Melburn Hake, HOYT Weeks in Treatment: 5 Encounter Discharge Information Items Discharge Condition: Stable Ambulatory Status:  Ambulatory Discharge Destination: Home Transportation: Private Auto Accompanied By: self Schedule Follow-up Appointment: No Clinical Summary of Care: Electronic Signature(s) Signed: 08/21/2019 3:58:18 PM By: Montey Hora Entered By: Montey Hora on 08/21/2019 09:22:15 Girardot, [name] ([id number]) -------------------------------------------------------------------------------- Lower Extremity Assessment Details Patient Name: Carranza, [name] Date of Service: 08/21/2019 9:00 AM Medical Record Number: [id number] Patient Account Number: [id number] Date of Birth/Sex: 02-07-1946 (74 y.o. F) Treating RN: Army Melia Primary Care Emry Tobin: Domenick Gong Other Clinician: Referring Rayhaan Huster: Domenick Gong Treating Aarav Burgett/Extender: STONE III, HOYT Weeks in Treatment: 5 Edema Assessment Assessed: [Left: No] [Right: No] Edema: [Left: N] [Right: o] Vascular Assessment Pulses: Dorsalis Pedis Palpable: [Right:Yes] Electronic Signature(s) Signed: 08/21/2019 3:19:07 PM By: Army Melia Entered By: Army Melia on 08/21/2019 09:04:48 Perfecto, [name] ([id number]) -------------------------------------------------------------------------------- Multi Wound Chart Details Patient Name: [name], [name] Date of Service: 08/21/2019 9:00 AM Medical Record Number: [id number] Patient Account Number: [id number] Date of Birth/Sex: [birth year] (74 y.o. F) Treating RN: Montey Hora Primary Care Rubbie Goostree: Domenick Gong Other Clinician: Referring Arleatha Philipps: Domenick Gong Treating Imanuel Pruiett/Extender: Melburn Hake, HOYT Weeks in Treatment: 5 Vital Signs Height(in): 59 Pulse(bpm): 55 Weight(lbs): 178 Blood Pressure(mmHg): 152/58 Body Mass Index(BMI): 36 Temperature(F): 97.8 Respiratory Rate(breaths/min): 16 Photos: [N/A:N/A] Wound Location: Right, Anterior Lower Leg N/A N/A Wounding Event: Trauma N/A N/A Primary Etiology: Trauma, Other N/A N/A Comorbid History: Hypertension, Type II  Diabetes N/A N/A Date Acquired: 06/20/2019 N/A N/A Weeks of Treatment: 5 N/A N/A Wound Status: Open N/A N/A Measurements L x W x D (cm) 0x0x0 N/A N/A Area (cm) : 0 N/A N/A Volume (cm) : 0 N/A N/A % Reduction in Area: 100.00% N/A N/A % Reduction in Volume: 100.00% N/A N/A Classification: Full Thickness Without Exposed N/A N/A Support Structures Exudate Amount: None Present N/A N/A Wound Margin: Flat and Intact N/A N/A Granulation Amount: None Present (0%) N/A N/A Necrotic Amount: None Present (0%) N/A N/A Exposed Structures:  Fascia: No N/A N/A Fat Layer (Subcutaneous Tissue) Exposed: No Tendon: No Muscle: No Joint: No Bone: No Limited to Skin Breakdown Epithelialization: Large (67-100%) N/A N/A Treatment Notes Electronic Signature(s) Signed: 08/21/2019 3:58:18 PM By: Montey Hora Entered By: Montey Hora on 08/21/2019 09:20:40 Carradine, [name] ([id number]) -------------------------------------------------------------------------------- Multi-Disciplinary Care Plan Details Patient Name: [name], [name] Date of Service: 08/21/2019 9:00 AM Medical Record Number: [id number] Patient Account Number: [id number] Date of Birth/Sex: 1945/11/03 (74 y.o. F) Treating RN: Montey Hora Primary Care Azia Toutant: Domenick Gong Other Clinician: Referring Mirenda Baltazar: Domenick Gong Treating May Ozment/Extender: Melburn Hake, HOYT Weeks in Treatment: 5 Active Inactive Electronic Signature(s) Signed: 08/21/2019 3:58:18 PM By: Montey Hora Entered By: Montey Hora on 08/21/2019 09:21:14 Delpozo, [name] Kitchen ([id number]) -------------------------------------------------------------------------------- Pain Assessment Details Patient Name: [name], Shawnie C. Date of Service: 08/21/2019 9:00 AM Medical Record Number: [id number] Patient Account Number: [id number] Date of Birth/Sex: [birth year] (74 y.o. F) Treating RN: Army Melia Primary Care Madilyn Cephas: Domenick Gong Other Clinician: Referring  Shant Hence: Domenick Gong Treating Rhythm Gubbels/Extender: Melburn Hake, HOYT Weeks in Treatment: 5 Active Problems Location of Pain Severity and Description of Pain Patient Has Paino No Site Locations Pain Management and Medication Current Pain Management: Electronic Signature(s) Signed: 08/21/2019 3:19:07 PM By: Army Melia Entered By: Army Melia on 08/21/2019 09:02:27 [name], [name] ([id number]) -------------------------------------------------------------------------------- Patient/Caregiver Education Details Patient Name: [name], Nairi C. Date of Service: 08/21/2019 9:00 AM Medical Record Number: [id number] Patient Account Number: [id number] Date of Birth/Gender: [birth year] (74 y.o. F) Treating RN: Montey Hora Primary Care Physician: Domenick Gong Other Clinician: Referring Physician: Domenick Gong Treating Physician/Extender: Sharalyn Ink in Treatment: 5 Education Assessment Education Provided To: Patient Education Topics Provided Basic Hygiene: Handouts: Other: skin care Methods: Explain/Verbal Responses: State content correctly Electronic Signature(s) Signed: 08/21/2019 3:58:18 PM By: Montey Hora Entered By: Montey Hora on 08/21/2019 09:22:00 Colbaugh, [place]. ([id number]) -------------------------------------------------------------------------------- Wound Assessment Details Patient Name: Graddy, Anahi C. Date of Service: 08/21/2019 9:00 AM Medical Record Number: [id number] Patient Account Number: [id number] Date of Birth/Sex: [birth year] (74 y.o. F) Treating RN: Army Melia Primary Care Lianah Peed: Domenick Gong Other Clinician: Referring Jamarri Vuncannon: Domenick Gong Treating Arrington Yohe/Extender: Melburn Hake, HOYT Weeks in Treatment: 5 Wound Status Wound Number: 1 Primary Etiology: Trauma, Other Wound Location: Right, Anterior Lower Leg Wound Status: Open Wounding Event: Trauma Comorbid History: Hypertension, Type II Diabetes Date Acquired:  06/20/2019 Weeks Of Treatment: 5 Clustered Wound: No Photos Wound Measurements Length: (cm) 0 Width: (cm) 0 Depth: (cm) 0 Area: (cm) 0 Volume: (cm) 0 % Reduction in Area: 100% % Reduction in Volume: 100% Epithelialization: Large (67-100%) Wound Description Classification: Full Thickness Without Exposed Support Structure Wound Margin: Flat and Intact Exudate Amount: None Present s Foul Odor After Cleansing: No Slough/Fibrino No Wound Bed Granulation Amount: None Present (0%) Exposed Structure Necrotic Amount: None Present (0%) Fascia Exposed: No Fat Layer (Subcutaneous Tissue) Exposed: No Tendon Exposed: No Muscle Exposed: No Joint Exposed: No Bone Exposed: No Limited to Skin Breakdown Electronic Signature(s) Signed: 08/21/2019 3:19:07 PM By: Army Melia Signed: 08/21/2019 3:58:18 PM By: Montey Hora Entered By: Montey Hora on 08/21/2019 09:19:33 Mangione, Shamaine C. ([id number]) -------------------------------------------------------------------------------- [place] Details Patient Name: [name], Sheran C. Date of Service: 08/21/2019 9:00 AM Medical Record Number: [id number] Patient Account Number: [id number] Date of Birth/Sex: 1945/05/14 (74 y.o. F) Treating RN: Army Melia Primary Care Mida Cory: Domenick Gong Other Clinician: Referring Mystic Labo: Domenick Gong Treating Loveta Dellis/Extender: Melburn Hake, HOYT Weeks in Treatment: 5 Vital Signs Time Taken: 09:02 Temperature (F): 97.8 Height (in): 59 Pulse (bpm):  55 Weight (lbs): 178 Respiratory Rate (breaths/min): 16 Body Mass Index (BMI): 35.9 Blood Pressure (mmHg): 152/58 Reference Range: 80 - 120 mg / dl Electronic Signature(s) Signed: 08/21/2019 3:19:07 PM By: Army Melia Entered By: Army Melia on 08/21/2019 09:02:22

## 2019-10-07 DIAGNOSIS — Z20822 Contact with and (suspected) exposure to covid-19: Secondary | ICD-10-CM | POA: Diagnosis not present

## 2019-10-07 DIAGNOSIS — Z03818 Encounter for observation for suspected exposure to other biological agents ruled out: Secondary | ICD-10-CM | POA: Diagnosis not present

## 2019-10-08 DIAGNOSIS — Z20822 Contact with and (suspected) exposure to covid-19: Secondary | ICD-10-CM | POA: Diagnosis not present

## 2019-12-14 DIAGNOSIS — E119 Type 2 diabetes mellitus without complications: Secondary | ICD-10-CM | POA: Diagnosis not present

## 2019-12-31 DIAGNOSIS — Z23 Encounter for immunization: Secondary | ICD-10-CM | POA: Diagnosis not present

## 2020-01-05 DIAGNOSIS — L400 Psoriasis vulgaris: Secondary | ICD-10-CM | POA: Diagnosis not present

## 2020-01-05 DIAGNOSIS — Z85820 Personal history of malignant melanoma of skin: Secondary | ICD-10-CM | POA: Diagnosis not present

## 2020-01-05 DIAGNOSIS — L821 Other seborrheic keratosis: Secondary | ICD-10-CM | POA: Diagnosis not present

## 2020-01-05 DIAGNOSIS — L718 Other rosacea: Secondary | ICD-10-CM | POA: Diagnosis not present

## 2020-01-05 DIAGNOSIS — L814 Other melanin hyperpigmentation: Secondary | ICD-10-CM | POA: Diagnosis not present

## 2020-01-05 DIAGNOSIS — L570 Actinic keratosis: Secondary | ICD-10-CM | POA: Diagnosis not present

## 2020-01-12 DIAGNOSIS — Z23 Encounter for immunization: Secondary | ICD-10-CM | POA: Diagnosis not present

## 2020-01-20 DIAGNOSIS — H0015 Chalazion left lower eyelid: Secondary | ICD-10-CM | POA: Diagnosis not present

## 2020-01-28 DIAGNOSIS — R0981 Nasal congestion: Secondary | ICD-10-CM | POA: Diagnosis not present

## 2020-01-28 DIAGNOSIS — F321 Major depressive disorder, single episode, moderate: Secondary | ICD-10-CM | POA: Diagnosis not present

## 2020-01-28 DIAGNOSIS — E663 Overweight: Secondary | ICD-10-CM | POA: Diagnosis not present

## 2020-01-28 DIAGNOSIS — E039 Hypothyroidism, unspecified: Secondary | ICD-10-CM | POA: Diagnosis not present

## 2020-01-28 DIAGNOSIS — E1149 Type 2 diabetes mellitus with other diabetic neurological complication: Secondary | ICD-10-CM | POA: Diagnosis not present

## 2020-01-28 DIAGNOSIS — E7800 Pure hypercholesterolemia, unspecified: Secondary | ICD-10-CM | POA: Diagnosis not present

## 2020-01-28 DIAGNOSIS — M1990 Unspecified osteoarthritis, unspecified site: Secondary | ICD-10-CM | POA: Diagnosis not present

## 2020-01-28 DIAGNOSIS — I10 Essential (primary) hypertension: Secondary | ICD-10-CM | POA: Diagnosis not present

## 2020-01-28 DIAGNOSIS — R5081 Fever presenting with conditions classified elsewhere: Secondary | ICD-10-CM | POA: Diagnosis not present

## 2020-01-28 DIAGNOSIS — F411 Generalized anxiety disorder: Secondary | ICD-10-CM | POA: Diagnosis not present

## 2020-02-02 DIAGNOSIS — Z20822 Contact with and (suspected) exposure to covid-19: Secondary | ICD-10-CM | POA: Diagnosis not present

## 2020-05-03 DIAGNOSIS — J302 Other seasonal allergic rhinitis: Secondary | ICD-10-CM | POA: Diagnosis not present

## 2020-05-03 DIAGNOSIS — F321 Major depressive disorder, single episode, moderate: Secondary | ICD-10-CM | POA: Diagnosis not present

## 2020-05-03 DIAGNOSIS — F411 Generalized anxiety disorder: Secondary | ICD-10-CM | POA: Diagnosis not present

## 2020-05-30 DIAGNOSIS — Z880 Allergy status to penicillin: Secondary | ICD-10-CM | POA: Diagnosis not present

## 2020-05-30 DIAGNOSIS — R300 Dysuria: Secondary | ICD-10-CM | POA: Diagnosis not present

## 2020-05-30 DIAGNOSIS — R0789 Other chest pain: Secondary | ICD-10-CM | POA: Diagnosis not present

## 2020-05-30 DIAGNOSIS — M7989 Other specified soft tissue disorders: Secondary | ICD-10-CM | POA: Diagnosis not present

## 2020-05-30 DIAGNOSIS — R079 Chest pain, unspecified: Secondary | ICD-10-CM | POA: Diagnosis not present

## 2020-05-30 DIAGNOSIS — R0602 Shortness of breath: Secondary | ICD-10-CM | POA: Diagnosis not present

## 2020-05-30 DIAGNOSIS — I10 Essential (primary) hypertension: Secondary | ICD-10-CM | POA: Diagnosis not present

## 2020-05-30 DIAGNOSIS — R9431 Abnormal electrocardiogram [ECG] [EKG]: Secondary | ICD-10-CM | POA: Diagnosis not present

## 2020-05-30 DIAGNOSIS — R509 Fever, unspecified: Secondary | ICD-10-CM | POA: Diagnosis not present

## 2020-05-30 DIAGNOSIS — Z20822 Contact with and (suspected) exposure to covid-19: Secondary | ICD-10-CM | POA: Diagnosis not present

## 2020-06-03 DIAGNOSIS — L409 Psoriasis, unspecified: Secondary | ICD-10-CM | POA: Diagnosis not present

## 2020-06-03 DIAGNOSIS — E1149 Type 2 diabetes mellitus with other diabetic neurological complication: Secondary | ICD-10-CM | POA: Diagnosis not present

## 2020-06-03 DIAGNOSIS — I10 Essential (primary) hypertension: Secondary | ICD-10-CM | POA: Diagnosis not present

## 2020-06-03 DIAGNOSIS — E039 Hypothyroidism, unspecified: Secondary | ICD-10-CM | POA: Diagnosis not present

## 2020-06-03 DIAGNOSIS — E663 Overweight: Secondary | ICD-10-CM | POA: Diagnosis not present

## 2020-06-03 DIAGNOSIS — E7800 Pure hypercholesterolemia, unspecified: Secondary | ICD-10-CM | POA: Diagnosis not present

## 2020-06-03 DIAGNOSIS — R0789 Other chest pain: Secondary | ICD-10-CM | POA: Diagnosis not present

## 2020-06-03 DIAGNOSIS — F411 Generalized anxiety disorder: Secondary | ICD-10-CM | POA: Diagnosis not present

## 2020-06-09 DIAGNOSIS — F321 Major depressive disorder, single episode, moderate: Secondary | ICD-10-CM | POA: Diagnosis not present

## 2020-06-09 DIAGNOSIS — I10 Essential (primary) hypertension: Secondary | ICD-10-CM | POA: Diagnosis not present

## 2020-06-09 DIAGNOSIS — M1990 Unspecified osteoarthritis, unspecified site: Secondary | ICD-10-CM | POA: Diagnosis not present

## 2020-06-09 DIAGNOSIS — E7800 Pure hypercholesterolemia, unspecified: Secondary | ICD-10-CM | POA: Diagnosis not present

## 2020-06-09 DIAGNOSIS — J302 Other seasonal allergic rhinitis: Secondary | ICD-10-CM | POA: Diagnosis not present

## 2020-06-09 DIAGNOSIS — E1149 Type 2 diabetes mellitus with other diabetic neurological complication: Secondary | ICD-10-CM | POA: Diagnosis not present

## 2020-06-09 DIAGNOSIS — F411 Generalized anxiety disorder: Secondary | ICD-10-CM | POA: Diagnosis not present

## 2020-06-09 DIAGNOSIS — E039 Hypothyroidism, unspecified: Secondary | ICD-10-CM | POA: Diagnosis not present

## 2020-07-01 DIAGNOSIS — Z6835 Body mass index (BMI) 35.0-35.9, adult: Secondary | ICD-10-CM | POA: Diagnosis not present

## 2020-07-01 DIAGNOSIS — I10 Essential (primary) hypertension: Secondary | ICD-10-CM | POA: Diagnosis not present

## 2020-07-01 DIAGNOSIS — R079 Chest pain, unspecified: Secondary | ICD-10-CM | POA: Diagnosis not present

## 2020-07-12 DIAGNOSIS — L409 Psoriasis, unspecified: Secondary | ICD-10-CM | POA: Diagnosis not present

## 2020-07-12 DIAGNOSIS — I10 Essential (primary) hypertension: Secondary | ICD-10-CM | POA: Diagnosis not present

## 2020-07-12 DIAGNOSIS — E039 Hypothyroidism, unspecified: Secondary | ICD-10-CM | POA: Diagnosis not present

## 2020-07-12 DIAGNOSIS — F32A Depression, unspecified: Secondary | ICD-10-CM | POA: Diagnosis not present

## 2020-07-12 DIAGNOSIS — L659 Nonscarring hair loss, unspecified: Secondary | ICD-10-CM | POA: Diagnosis not present

## 2020-07-12 DIAGNOSIS — R7303 Prediabetes: Secondary | ICD-10-CM | POA: Diagnosis not present

## 2020-07-12 DIAGNOSIS — F419 Anxiety disorder, unspecified: Secondary | ICD-10-CM | POA: Diagnosis not present

## 2020-07-12 DIAGNOSIS — E785 Hyperlipidemia, unspecified: Secondary | ICD-10-CM | POA: Diagnosis not present

## 2020-07-12 DIAGNOSIS — Z1211 Encounter for screening for malignant neoplasm of colon: Secondary | ICD-10-CM | POA: Diagnosis not present

## 2020-07-12 DIAGNOSIS — M25561 Pain in right knee: Secondary | ICD-10-CM | POA: Diagnosis not present

## 2020-07-12 DIAGNOSIS — T7840XS Allergy, unspecified, sequela: Secondary | ICD-10-CM | POA: Diagnosis not present

## 2020-07-12 DIAGNOSIS — Z1159 Encounter for screening for other viral diseases: Secondary | ICD-10-CM | POA: Diagnosis not present

## 2020-07-12 DIAGNOSIS — C4361 Malignant melanoma of right upper limb, including shoulder: Secondary | ICD-10-CM | POA: Diagnosis not present

## 2020-07-27 DIAGNOSIS — R079 Chest pain, unspecified: Secondary | ICD-10-CM | POA: Diagnosis not present

## 2020-07-27 DIAGNOSIS — I351 Nonrheumatic aortic (valve) insufficiency: Secondary | ICD-10-CM | POA: Diagnosis not present

## 2020-07-27 DIAGNOSIS — I10 Essential (primary) hypertension: Secondary | ICD-10-CM | POA: Diagnosis not present

## 2020-08-16 DIAGNOSIS — F419 Anxiety disorder, unspecified: Secondary | ICD-10-CM | POA: Diagnosis not present

## 2020-08-16 DIAGNOSIS — Z6835 Body mass index (BMI) 35.0-35.9, adult: Secondary | ICD-10-CM | POA: Diagnosis not present

## 2020-08-16 DIAGNOSIS — F32A Depression, unspecified: Secondary | ICD-10-CM | POA: Diagnosis not present

## 2020-08-16 DIAGNOSIS — L0390 Cellulitis, unspecified: Secondary | ICD-10-CM | POA: Diagnosis not present

## 2020-08-16 DIAGNOSIS — L409 Psoriasis, unspecified: Secondary | ICD-10-CM | POA: Diagnosis not present

## 2020-08-18 DIAGNOSIS — L0889 Other specified local infections of the skin and subcutaneous tissue: Secondary | ICD-10-CM | POA: Diagnosis not present

## 2020-08-18 DIAGNOSIS — Z85820 Personal history of malignant melanoma of skin: Secondary | ICD-10-CM | POA: Diagnosis not present

## 2020-08-18 DIAGNOSIS — L400 Psoriasis vulgaris: Secondary | ICD-10-CM | POA: Diagnosis not present

## 2020-09-06 DIAGNOSIS — E785 Hyperlipidemia, unspecified: Secondary | ICD-10-CM | POA: Diagnosis not present

## 2020-09-06 DIAGNOSIS — F419 Anxiety disorder, unspecified: Secondary | ICD-10-CM | POA: Diagnosis not present

## 2020-09-06 DIAGNOSIS — I10 Essential (primary) hypertension: Secondary | ICD-10-CM | POA: Diagnosis not present

## 2020-09-06 DIAGNOSIS — Z6834 Body mass index (BMI) 34.0-34.9, adult: Secondary | ICD-10-CM | POA: Diagnosis not present

## 2020-09-06 DIAGNOSIS — Z23 Encounter for immunization: Secondary | ICD-10-CM | POA: Diagnosis not present

## 2020-09-06 DIAGNOSIS — R7309 Other abnormal glucose: Secondary | ICD-10-CM | POA: Diagnosis not present

## 2020-09-06 DIAGNOSIS — F32A Depression, unspecified: Secondary | ICD-10-CM | POA: Diagnosis not present

## 2020-09-29 DIAGNOSIS — E785 Hyperlipidemia, unspecified: Secondary | ICD-10-CM | POA: Diagnosis not present

## 2020-09-29 DIAGNOSIS — Z6835 Body mass index (BMI) 35.0-35.9, adult: Secondary | ICD-10-CM | POA: Diagnosis not present

## 2020-09-29 DIAGNOSIS — I10 Essential (primary) hypertension: Secondary | ICD-10-CM | POA: Diagnosis not present

## 2020-10-05 DIAGNOSIS — Z23 Encounter for immunization: Secondary | ICD-10-CM | POA: Diagnosis not present

## 2020-10-29 DIAGNOSIS — L729 Follicular cyst of the skin and subcutaneous tissue, unspecified: Secondary | ICD-10-CM | POA: Diagnosis not present

## 2020-10-29 DIAGNOSIS — L089 Local infection of the skin and subcutaneous tissue, unspecified: Secondary | ICD-10-CM | POA: Diagnosis not present

## 2020-10-31 DIAGNOSIS — Z85820 Personal history of malignant melanoma of skin: Secondary | ICD-10-CM | POA: Diagnosis not present

## 2020-10-31 DIAGNOSIS — L723 Sebaceous cyst: Secondary | ICD-10-CM | POA: Diagnosis not present

## 2020-10-31 DIAGNOSIS — Z79899 Other long term (current) drug therapy: Secondary | ICD-10-CM | POA: Diagnosis not present

## 2020-12-05 ENCOUNTER — Other Ambulatory Visit: Payer: Self-pay

## 2020-12-07 DIAGNOSIS — I10 Essential (primary) hypertension: Secondary | ICD-10-CM | POA: Diagnosis not present

## 2020-12-07 DIAGNOSIS — E039 Hypothyroidism, unspecified: Secondary | ICD-10-CM | POA: Diagnosis not present

## 2020-12-07 DIAGNOSIS — Z79899 Other long term (current) drug therapy: Secondary | ICD-10-CM | POA: Diagnosis not present

## 2020-12-07 DIAGNOSIS — F32A Depression, unspecified: Secondary | ICD-10-CM | POA: Diagnosis not present

## 2020-12-07 DIAGNOSIS — E785 Hyperlipidemia, unspecified: Secondary | ICD-10-CM | POA: Diagnosis not present

## 2020-12-07 DIAGNOSIS — F419 Anxiety disorder, unspecified: Secondary | ICD-10-CM | POA: Diagnosis not present

## 2020-12-07 DIAGNOSIS — W2209XA Striking against other stationary object, initial encounter: Secondary | ICD-10-CM | POA: Diagnosis not present

## 2020-12-07 DIAGNOSIS — R269 Unspecified abnormalities of gait and mobility: Secondary | ICD-10-CM | POA: Diagnosis not present

## 2020-12-07 DIAGNOSIS — T7840XS Allergy, unspecified, sequela: Secondary | ICD-10-CM | POA: Diagnosis not present

## 2020-12-07 DIAGNOSIS — Z79890 Hormone replacement therapy (postmenopausal): Secondary | ICD-10-CM | POA: Diagnosis not present

## 2020-12-13 DIAGNOSIS — E119 Type 2 diabetes mellitus without complications: Secondary | ICD-10-CM | POA: Diagnosis not present

## 2020-12-15 DIAGNOSIS — R296 Repeated falls: Secondary | ICD-10-CM | POA: Diagnosis not present

## 2020-12-15 DIAGNOSIS — Z9181 History of falling: Secondary | ICD-10-CM | POA: Diagnosis not present

## 2020-12-15 DIAGNOSIS — R2681 Unsteadiness on feet: Secondary | ICD-10-CM | POA: Diagnosis not present

## 2020-12-16 ENCOUNTER — Other Ambulatory Visit: Payer: Self-pay

## 2020-12-16 ENCOUNTER — Ambulatory Visit
Admission: RE | Admit: 2020-12-16 | Discharge: 2020-12-16 | Disposition: A | Discharge status: Home / Self Care | Payer: Medicare Other | Source: Ambulatory Visit | Attending: Emergency Medicine | Admitting: Emergency Medicine

## 2020-12-16 VITALS — BP 147/69 | HR 58 | Temp 98.1°F | Resp 14 | Ht 59.0 in | Wt 170.0 lb

## 2020-12-16 DIAGNOSIS — L03115 Cellulitis of right lower limb: Secondary | ICD-10-CM

## 2020-12-16 DIAGNOSIS — J209 Acute bronchitis, unspecified: Secondary | ICD-10-CM

## 2020-12-16 MED ORDER — DOXYCYCLINE MONOHYDRATE 100 MG PO TABS: 100.0000 mg | ORAL_TABLET | Freq: Two times a day (BID) | ORAL | 0 refills | Status: AC

## 2020-12-16 MED ORDER — PROMETHAZINE-DM 6.25-15 MG/5ML PO SYRP: 5.0000 mL | ORAL_SOLUTION | Freq: Four times a day (QID) | ORAL | 0 refills | Status: DC | PRN

## 2020-12-16 MED ORDER — IPRATROPIUM BROMIDE 0.06 % NA SOLN: 2.0000 | Freq: Four times a day (QID) | NASAL | 12 refills | Status: AC

## 2020-12-16 MED ORDER — BENZONATATE 100 MG PO CAPS: 200.0000 mg | ORAL_CAPSULE | Freq: Three times a day (TID) | ORAL | 0 refills | Status: DC

## 2020-12-16 NOTE — ED Triage Notes (Addendum)
Patient states that she hit her right lower leg on the car door on 12/13/20.  Patient states that she had a skin tear and now it has gotten infected.  Patient reports redness, drainage and low grade fevers. Patient states that she saw her PCP on 12/15/20 for her wound and at the time it was not infected.

## 2020-12-16 NOTE — Discharge Instructions (Addendum)
Take the Doxycycline twice daily with food for 10 days.  Doxycycline will make you more sensitive to sunburn so wear sunscreen when outdoors and reapply it every 90 minutes.  Stop applying ointment and allow the wound to form a scab.  Leave the would open to air at home and cover with a dry dressing when out in public.  Use the Atrovent nasal spray, 2 squirts in each nostril every 6 hours, as needed for runny nose and postnasal drip.  Use the Tessalon Perles every 8 hours during the day.  Take them with a small sip of water.  They may give you some numbness to the base of your tongue or a metallic taste in your mouth, this is normal.  Use the Promethazine DM cough syrup at bedtime for cough and congestion.  It will make you drowsy so do not take it during the day.  Make sure that you are steady if you have to get out of bed in the midline to use bathroom.  Sit on the edge of the bed and make sure that you are awake and clearheaded before you attempt to stand and ambulate to the bathroom.  Use OTC Tylenol and Ibuprofen according to the package instructions as needed for pain.  Return for new or worsening symptoms.

## 2020-12-16 NOTE — ED Provider Notes (Addendum)
[facility]    CSN: [id number] Arrival date & time: 12/16/20  1245      History   Chief Complaint Chief Complaint  Patient presents with   Wound Infection    HPI [name] is a 75 y.o. female.   HPI  75 year old female here for evaluation of multiple complaints.  Patient reports that she is here for 2 different purposes.  The first purpose is to have a skin tear on her right lower leg evaluated.  She reports that she cut her right shin on her car door on 12/05/2020, was evaluated by her PCP on 12/07/2020, and was told to monitor the area for redness or drainage.  She reports that she has been keeping a dressing on her wound and applying ointment to it daily.  She developed redness 3 to 4 days ago and she states that now it is draining a green drainage.  Patient also has psoriasis on her lower extremities and a history of diabetes.  She is here because she is concerned that it is infected.  Her second issue involves a respiratory complaint.  Patient reports that for the last week she has been experiencing a runny nose with a sore throat, green nasal discharge, and a nonproductive cough.  This morning she developed a fever with a T-max of 100 that responded to Tylenol.  She denies any ear pain, shortness of breath, or wheezing.  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    Past Surgical History:  Procedure Laterality Date   ABDOMINAL HYSTERECTOMY  1984   APPENDECTOMY     BACK SURGERY     lumbar   CARPAL TUNNEL RELEASE  1986   CESAREAN SECTION     x2   CHOLECYSTECTOMY N/A 07/14/2015    Procedure: LAPAROSCOPIC CHOLECYSTECTOMY WITH INTRAOPERATIVE CHOLANGIOGRAM;  Surgeon: Erroll Luna, MD;  Location: [place];  Service: General;  Laterality: N/A;   DIAGNOSTIC LAPAROSCOPY     DILATION AND CURETTAGE OF [place]  11-2009   MELANOMA EXCISION WITH SENTINEL LYMPH NODE BIOPSY Right 04/21/2015   Procedure: MELANOMA EXCISION RIGHT SHOULDER WITH SENTINEL LYMPH NODE BIOPSY;  Surgeon: Erroll Luna, MD;  Location: [place];  Service: General;  Laterality: Right;   Sentinel node mapping right neck   TONSILLECTOMY      OB History   No obstetric history on file.      Home Medications    Prior to Admission medications   Medication Sig Start Date End Date Taking? Authorizing Provider  benzonatate (TESSALON) 100 MG capsule Take 2 capsules (200 mg total) by mouth every 8 (eight) hours. 12/16/20  Yes Margarette Canada, NP  Calcium Carbonate-Vitamin D (CALTRATE 600+D PO) Take by mouth.   Yes [provider]  ipratropium (ATROVENT) 0.06 % nasal spray Place 2 sprays into both nostrils 4 (four) times daily. 12/16/20  Yes Margarette Canada, NP  levothyroxine (SYNTHROID, LEVOTHROID) 100  MCG tablet Take 100 mcg by mouth daily before breakfast.   Yes [provider]  lisinopril-hydrochlorothiazide (PRINZIDE,ZESTORETIC) 10-12.5 MG per tablet Take 1 tablet by mouth daily.   Yes [provider]  montelukast (SINGULAIR) 10 MG tablet Take 10 mg by mouth daily.   Yes [provider]  promethazine-dextromethorphan (PROMETHAZINE-DM) 6.25-15 MG/5ML syrup Take 5 mLs by mouth 4 (four) times daily as needed. 12/16/20  Yes Margarette Canada, NP  sertraline (ZOLOFT) 100 MG tablet Take 100 mg by mouth daily.   Yes [provider]  aspirin EC 81 MG tablet Take 81 mg by mouth daily.    [provider]  calcipotriene-betamethasone (TACLONEX) ointment Apply  topically 2 (two) times daily. For 6 weeks    [provider]  doxycycline (ADOXA) 100 MG tablet Take 1 tablet (100 mg total) by mouth 2 (two) times daily for 10 days. 12/16/20 12/26/20  Margarette Canada, NP  FINACEA 15 % cream  03/10/15   [provider]  naproxen (NAPROSYN) 375 MG tablet Take 1 tablet (375 mg total) by mouth 2 (two) times daily. 02/23/18   Melynda Ripple, MD  [facility] DELICA LANCETS 78L MISC TEST SUGAR ONCE D 05/13/15   [provider]  Roma Schanz test strip TEST ONCE D 03/12/15   [provider]    Family History Family History  Problem Relation Age of Onset   Colon cancer Brother    Liver disease Brother    Pancreatic cancer Brother    Heart disease Mother    Anemia Mother    Heart disease Father     Social History Social History   Tobacco Use   Smoking status: Former   Smokeless tobacco: Never  Scientific laboratory technician Use: Never used  Substance Use Topics   Alcohol use: No   Drug use: No     Allergies   Biaxin [clarithromycin], Penicillins, Sulfamethoxazole, and Soriatane [acitretin]   Review of Systems Review of Systems  Constitutional:  Positive for fever. Negative for activity change and appetite change.  HENT:  Positive for congestion, postnasal drip, rhinorrhea and sore throat. Negative for ear pain.   Respiratory:  Positive for cough. Negative for shortness of breath and wheezing.   Gastrointestinal:  Negative for diarrhea, nausea and vomiting.  Skin:  Positive for color change and wound.  Hematological: Negative.   Psychiatric/Behavioral: Negative.      Physical Exam Triage Vital Signs ED Triage Vitals  Enc Vitals Group     BP 12/16/20 1259 (!) 147/69     Pulse Rate 12/16/20 1259 (!) 58     Resp 12/16/20 1259 14     Temp 12/16/20 1259 98.1 F (36.7 C)     Temp Source 12/16/20 1259 Oral     SpO2 12/16/20 1259 98 %     Weight 12/16/20 1254 170 lb (77.1 kg)     Height 12/16/20 1254 4\' 11"  (1.499 m)      Head Circumference --      Peak Flow --      Pain Score 12/16/20 1254 5     Pain Loc --      Pain Edu? --      Excl. in [place]? --    No data found.  Updated Vital Signs BP (!) 147/69 (BP Location: Left Arm)   Pulse (!) 58   Temp 98.1 F (36.7 C) (Oral)   Resp 14   Ht 4\' 11"  (1.499 m)   Wt 170 lb (77.1  kg)   SpO2 98%   BMI 34.34 kg/m   Visual Acuity Right Eye Distance:   Left Eye Distance:   Bilateral Distance:    Right Eye Near:   Left Eye Near:    Bilateral Near:     Physical Exam Vitals and nursing note reviewed.  Constitutional:      General: She is not in acute distress.    Appearance: Normal appearance. She is obese. She is not ill-appearing.  HENT:     Head: Normocephalic and atraumatic.     Right Ear: Tympanic membrane, ear canal and external ear normal.     Left Ear: Tympanic membrane, ear canal and external ear normal.     Nose: Congestion and rhinorrhea present.     Mouth/Throat:     Mouth: Mucous membranes are moist.     Pharynx: Oropharynx is clear. No posterior oropharyngeal erythema.  Cardiovascular:     Rate and Rhythm: Normal rate and regular rhythm.     Pulses: Normal pulses.     Heart sounds: Normal heart sounds. No murmur heard.   No gallop.  Pulmonary:     Effort: Pulmonary effort is normal.     Breath sounds: Normal breath sounds. No wheezing, rhonchi or rales.  Musculoskeletal:        General: Signs of injury present.     Cervical back: Normal range of motion and neck supple.  Lymphadenopathy:     Cervical: No cervical adenopathy.  Skin:    General: Skin is warm and dry.     Capillary Refill: Capillary refill takes less than 2 seconds.     Findings: Erythema present.  Neurological:     General: No focal deficit present.     Mental Status: She is alert and oriented to person, place, and time.  Psychiatric:        Mood and Affect: Mood normal.        Behavior: Behavior normal.        Thought Content: Thought content normal.         Judgment: Judgment normal.     UC Treatments / Results  Labs (all labs ordered are listed, but only abnormal results are displayed) Labs Reviewed - No data to display  EKG   Radiology No results found.  Procedures Procedures (including critical care time)  Medications Ordered in UC Medications - No data to display  Initial Impression / Assessment and Plan / UC Course  I have reviewed the triage vital signs and the nursing notes.  Pertinent labs & imaging results that were available during my care of the patient were reviewed by me and considered in my medical decision making (see chart for details).  Patient is a very pleasant, nontoxic-appearing 75 year old female here for evaluation of respiratory complaints and wound infection to the right shin.  Patient suffered a skin tear to the anterior shin on her right lower leg on 12/05/2020 that involved her car door.  She was evaluated by her primary care doctor on 12/07/2020 and at that time the note indicates there is no signs of infection.  Patient developed some redness 3 to 4 days ago and developed some green drainage from the wound today.  She denies any red streaks going up her leg or significant swelling at the site.  She has been keeping a dressing and ointment on the wound.  Patient does have psoriasis on both lower extremities which given appearance of underlying erythema.  There are no psoriasis lesions around  the area of the skin tear.  The skin tear is dark with a moist scab.  There is some mild surrounding erythema without significant edema, no fluctuance or induration, and no appreciable drainage.  The area is warm to touch.  Upper respiratory exam reveals pearly gray tympanic membranes bilaterally with a normal light reflex.  Both external auditory canals are moderately ceruminous.  Nasal mucosa is erythematous and edematous with clear nasal discharge.  Oropharyngeal exam reveals mild posterior oropharyngeal erythema with clear  postnasal drip.  No cervical lymphadenopathy appreciable exam.  Cardiopulmonary exam reveals wheezes and rhonchi in bilateral upper lobes.  Suspect patient has a URI and bronchitis and will treat with Atrovent nasal spray, Tessalon Perles, and Promethazine DM cough syrup.  Patient's lower extremity does appear to be cellulitic and will cover with doxycycline twice daily for 10 days.  Patient advised to stop applying ointment to the wound as that is keeping it from forming a scab and inhibiting its wound healing.  I have also instructed her to leave it open to the air when she is at her house and to cover with a dressing when she goes out in public.  I have also advised her to use the Tessalon Perles and Atrovent nasal spray for cough.  I did give her Promethazine DM cough syrup that she can use at bedtime if needed.  I did advise her that this may make her drowsy and she is to be careful with her history of falls.  She is to make sure that if she is to get up in the [place] that she is steady and alert prior to attempting to get out of bed.  Patient verbalizes understanding of same.  Patient is currently undergoing testicular rehab to help with her frequent falls.   Final Clinical Impressions(s) / UC Diagnoses   Final diagnoses:  Cellulitis of right lower extremity  Acute bronchitis, unspecified organism     Discharge Instructions      Take the Doxycycline twice daily with food for 10 days.  Doxycycline will make you more sensitive to sunburn so wear sunscreen when outdoors and reapply it every 90 minutes.  Stop applying ointment and allow the wound to form a scab.  Leave the would open to air at home and cover with a dry dressing when out in public.  Use the Atrovent nasal spray, 2 squirts in each nostril every 6 hours, as needed for runny nose and postnasal drip.  Use the Tessalon Perles every 8 hours during the day.  Take them with a small sip of water.  They may give you some numbness to  the base of your tongue or a metallic taste in your mouth, this is normal.  Use the Promethazine DM cough syrup at bedtime for cough and congestion.  It will make you drowsy so do not take it during the day.  Make sure that you are steady if you have to get out of bed in the midline to use bathroom.  Sit on the edge of the bed and make sure that you are awake and clearheaded before you attempt to stand and ambulate to the bathroom.  Use OTC Tylenol and Ibuprofen according to the package instructions as needed for pain.  Return for new or worsening symptoms.       ED Prescriptions     Medication Sig Dispense Auth. Provider   doxycycline (ADOXA) 100 MG tablet Take 1 tablet (100 mg total) by mouth 2 (two) times daily  for 10 days. 20 tablet Margarette Canada, NP   ipratropium (ATROVENT) 0.06 % nasal spray Place 2 sprays into both nostrils 4 (four) times daily. 15 mL Margarette Canada, NP   benzonatate (TESSALON) 100 MG capsule Take 2 capsules (200 mg total) by mouth every 8 (eight) hours. 21 capsule Margarette Canada, NP   promethazine-dextromethorphan (PROMETHAZINE-DM) 6.25-15 MG/5ML syrup Take 5 mLs by mouth 4 (four) times daily as needed. 118 mL Margarette Canada, NP      PDMP not reviewed this encounter.   Margarette Canada, NP 12/16/20 1334    Margarette Canada, NP 12/16/20 1335

## 2020-12-20 ENCOUNTER — Ambulatory Visit
Admission: EM | Admit: 2020-12-20 | Discharge: 2020-12-20 | Disposition: A | Discharge status: Home / Self Care | Payer: Medicare Other | Attending: Physician Assistant | Admitting: Physician Assistant

## 2020-12-20 ENCOUNTER — Ambulatory Visit: Admit: 2020-12-20 | Payer: Medicare Other

## 2020-12-20 ENCOUNTER — Ambulatory Visit (INDEPENDENT_AMBULATORY_CARE_PROVIDER_SITE_OTHER): Payer: Medicare Other

## 2020-12-20 ENCOUNTER — Ambulatory Visit: Payer: Medicare Other | Admitting: Internal Medicine

## 2020-12-20 ENCOUNTER — Other Ambulatory Visit: Payer: Self-pay

## 2020-12-20 VITALS — BP 139/57 | HR 78 | Temp 98.9°F | Resp 18 | Ht 59.0 in | Wt 170.0 lb

## 2020-12-20 DIAGNOSIS — R059 Cough, unspecified: Secondary | ICD-10-CM | POA: Diagnosis not present

## 2020-12-20 DIAGNOSIS — R051 Acute cough: Secondary | ICD-10-CM

## 2020-12-20 DIAGNOSIS — J209 Acute bronchitis, unspecified: Secondary | ICD-10-CM

## 2020-12-20 MED ORDER — HYDROCOD POLST-CPM POLST ER 10-8 MG/5ML PO SUER: 5.0000 mL | Freq: Every evening | ORAL | 0 refills | Status: DC | PRN

## 2020-12-20 MED ORDER — BENZONATATE 200 MG PO CAPS: 200.0000 mg | ORAL_CAPSULE | Freq: Three times a day (TID) | ORAL | 0 refills | Status: DC | PRN

## 2020-12-20 MED ORDER — PREDNISONE 20 MG PO TABS: 40.0000 mg | ORAL_TABLET | Freq: Every day | ORAL | 0 refills | Status: AC

## 2020-12-20 NOTE — ED Triage Notes (Signed)
Pt c/o cough. Started about 5 days ago. She states she was seen on 12/16/20 and given cough medication and has not helped. She states she is tatking doxy for her leg wound.

## 2020-12-20 NOTE — ED Provider Notes (Signed)
[facility]    CSN: [id number] Arrival date & time: 12/20/20  1843      History   Chief Complaint Chief Complaint  Patient presents with   Appointment   Cough    HPI [name] is a 75 y.o. female presenting for 5-day history of cough.  She says it is sometimes productive and feels like it stuck in her chest.  She admits to feeling short of breath when she gets into a coughing fit and says that she has had some posttussive vomiting of the mucus.  Denies any fevers.  Patient has had some nasal congestion and ear pressure that started either yesterday or today.  She was seen in the clinic a few days ago and started on doxycycline for an injury to her leg and infection.  She was also given Promethazine DM and benzonatate at that time.  Patient says she took an at home COVID test today but was not tested previously.  Her test was negative.  She denies any exposure.  No history of asthma or COPD.  Former smoker but no longer.  Patient says despite taking a lot of medication she is not feeling better.  She has no other concerns.  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    Past Surgical History:  Procedure Laterality Date   ABDOMINAL HYSTERECTOMY  1984   APPENDECTOMY     BACK SURGERY     lumbar   CARPAL TUNNEL RELEASE  1986   CESAREAN SECTION     x2   CHOLECYSTECTOMY N/A 07/14/2015   Procedure: LAPAROSCOPIC CHOLECYSTECTOMY WITH INTRAOPERATIVE CHOLANGIOGRAM;  Surgeon: Erroll Luna, MD;  Location: [place];  Service: General;  Laterality: N/A;   DIAGNOSTIC LAPAROSCOPY      DILATION AND CURETTAGE OF [place]  11-2009   MELANOMA EXCISION WITH SENTINEL LYMPH NODE BIOPSY Right 04/21/2015   Procedure: MELANOMA EXCISION RIGHT SHOULDER WITH SENTINEL LYMPH NODE BIOPSY;  Surgeon: Erroll Luna, MD;  Location: [place];  Service: General;  Laterality: Right;   Sentinel node mapping right neck   TONSILLECTOMY      OB History   No obstetric history on file.      Home Medications    Prior to Admission medications   Medication Sig Start Date End Date Taking? Authorizing Provider  aspirin EC 81 MG tablet Take 81 mg by mouth daily.   Yes [provider]  benzonatate (TESSALON) 100 MG capsule Take 2 capsules (200 mg total) by mouth every 8 (eight) hours. 12/16/20  Yes Margarette Canada, NP  calcipotriene-betamethasone (TACLONEX) ointment Apply topically 2 (two) times daily. For 6 weeks   Yes [provider]  Calcium Carbonate-Vitamin D (CALTRATE 600+D PO) Take by mouth.   Yes [provider]  chlorpheniramine-HYDROcodone (TUSSIONEX PENNKINETIC ER) 10-8 MG/5ML SUER Take 5 mLs by mouth at bedtime as needed for cough. 12/20/20  Yes Carlyon Prows,  Kennis Carina, PA-C  doxycycline (ADOXA) 100 MG tablet Take 1 tablet (100 mg total) by mouth 2 (two) times daily for 10 days. 12/16/20 12/26/20 Yes Margarette Canada, NP  FINACEA 15 % cream  03/10/15  Yes [provider]  ipratropium (ATROVENT) 0.06 % nasal spray Place 2 sprays into both nostrils 4 (four) times daily. 12/16/20  Yes Margarette Canada, NP  levothyroxine (SYNTHROID, LEVOTHROID) 100 MCG tablet Take 100 mcg by mouth daily before breakfast.   Yes [provider]  lisinopril-hydrochlorothiazide (PRINZIDE,ZESTORETIC) 10-12.5 MG per tablet Take 1 tablet by mouth daily.   Yes [provider]  montelukast (SINGULAIR) 10 MG tablet Take 10 mg by mouth daily.   Yes [provider]  naproxen  (NAPROSYN) 375 MG tablet Take 1 tablet (375 mg total) by mouth 2 (two) times daily. 02/23/18  Yes Melynda Ripple, MD  [facility] DELICA LANCETS 65K MISC TEST SUGAR ONCE D 05/13/15  Yes [provider]  [facility] VERIO test strip TEST ONCE D 03/12/15  Yes [provider]  predniSONE (DELTASONE) 20 MG tablet Take 2 tablets (40 mg total) by mouth daily for 5 days. 12/20/20 12/25/20 Yes Danton Clap, PA-C  promethazine-dextromethorphan (PROMETHAZINE-DM) 6.25-15 MG/5ML syrup Take 5 mLs by mouth 4 (four) times daily as needed. 12/16/20  Yes Margarette Canada, NP  sertraline (ZOLOFT) 100 MG tablet Take 100 mg by mouth daily.   Yes [provider]    Family History Family History  Problem Relation Age of Onset   Colon cancer Brother    Liver disease Brother    Pancreatic cancer Brother    Heart disease Mother    Anemia Mother    Heart disease Father     Social History Social History   Tobacco Use   Smoking status: Former   Smokeless tobacco: Never  Scientific laboratory technician Use: Never used  Substance Use Topics   Alcohol use: No   Drug use: No     Allergies   Biaxin [clarithromycin], Penicillins, Sulfamethoxazole, and Soriatane [acitretin]   Review of Systems Review of Systems  Constitutional:  Positive for fatigue. Negative for chills, diaphoresis and fever.  HENT:  Positive for congestion, ear pain, rhinorrhea and sinus pressure. Negative for sore throat.   Respiratory:  Positive for cough. Negative for shortness of breath.   Cardiovascular:  Negative for chest pain.  Gastrointestinal:  Negative for abdominal pain, nausea and vomiting.  Musculoskeletal:  Negative for arthralgias and myalgias.  Skin:  Negative for rash.  Neurological:  Negative for weakness and headaches.  Hematological:  Negative for adenopathy.  Psychiatric/Behavioral:  Positive for sleep disturbance.     Physical Exam Triage Vital Signs ED Triage Vitals  Enc Vitals Group     BP  12/20/20 1915 (!) 139/57     Pulse Rate 12/20/20 1915 78     Resp 12/20/20 1915 18     Temp 12/20/20 1915 98.9 F (37.2 C)     Temp Source 12/20/20 1915 Oral     SpO2 12/20/20 1915 93 %     Weight 12/20/20 1912 169 lb 15.6 oz (77.1 kg)     Height 12/20/20 1912 4\' 11"  (1.499 m)     Head Circumference --      Peak Flow --      Pain Score 12/20/20 1912 5     Pain Loc --      Pain Edu? --      Excl. in [place]? --    No  data found.  Updated Vital Signs BP (!) 139/57 (BP Location: Left Arm)   Pulse 78   Temp 98.9 F (37.2 C) (Oral)   Resp 18   Ht 4\' 11"  (1.499 m)   Wt 169 lb 15.6 oz (77.1 kg)   SpO2 100%   BMI 34.33 kg/m     Physical Exam Vitals and nursing note reviewed.  Constitutional:      General: She is not in acute distress.    Appearance: Normal appearance. She is not ill-appearing or toxic-appearing.     Comments: Coughs repeatedly during exam  HENT:     Head: Normocephalic and atraumatic.     Nose: Congestion present.     Mouth/Throat:     Mouth: Mucous membranes are moist.     Pharynx: Oropharynx is clear.  Eyes:     General: No scleral icterus.       Right eye: No discharge.        Left eye: No discharge.     Conjunctiva/sclera: Conjunctivae normal.  Cardiovascular:     Rate and Rhythm: Normal rate and regular rhythm.     Heart sounds: Normal heart sounds.  Pulmonary:     Effort: Pulmonary effort is normal. No respiratory distress.     Breath sounds: Wheezing (few scattered expiratory wheezes) and rhonchi (few scattered rhonchi) present.  Musculoskeletal:     Cervical back: Neck supple.  Skin:    General: Skin is dry.  Neurological:     General: No focal deficit present.     Mental Status: She is alert. Mental status is at baseline.     Motor: No weakness.     Gait: Gait normal.  Psychiatric:        Mood and Affect: Mood normal.        Behavior: Behavior normal.        Thought Content: Thought content normal.     UC Treatments / Results   Labs (all labs ordered are listed, but only abnormal results are displayed) Labs Reviewed - No data to display  EKG   Radiology DG Chest 2 View  Result Date: 12/20/2020 CLINICAL DATA:  cough, O2 93% EXAM: CHEST - 2 VIEW COMPARISON:  Radiograph 07/22/2015, chest CT 05/20/2015 FINDINGS: Unchanged cardiomediastinal silhouette. There is no focal airspace consolidation. There is no large pleural effusion or visible pneumothorax. There is no acute osseous abnormality. Thoracic spondylosis. IMPRESSION: No evidence of acute cardiopulmonary disease. Electronically Signed   By: Maurine Simmering M.D.   On: 12/20/2020 19:57    Procedures Procedures (including critical care time)  Medications Ordered in UC Medications - No data to display  Initial Impression / Assessment and Plan / UC Course  I have reviewed the triage vital signs and the nursing notes.  Pertinent labs & imaging results that were available during my care of the patient were reviewed by me and considered in my medical decision making (see chart for details).  75 year old female presenting for 5-day history of cough that is occasionally productive.  Admits to many coughing fits.  Patient has been on doxycycline for unrelated causes as well as Promethazine DM, benzonatate and nasal spray.  No improvement in symptoms.  Negative at home COVID test.  O2 initially 93%.  She is afebrile.  She does cough repeatedly.  Few scattered expiratory wheezes and rhonchi.  Chest x-ray obtained today and independently reviewed by me.  Chest x-ray is within normal limits.  Oxygen recheck is 100%.  Advised patient symptoms consistent  with viral bronchitis.  Advised her to continue the doxycycline and.  I have advised her to take the cough medication as prescribed during the day up to 3 times a day for the Promethazine DM and Tussionex at bedtime.  Advised to be careful with this as it can make her drowsy.  Increase rest and fluids.  Return as needed for any  fever, worsening cough or breathing problem.   Final Clinical Impressions(s) / UC Diagnoses   Final diagnoses:  Acute bronchitis, unspecified organism  Acute cough     Discharge Instructions      -The chest x-ray does not show any evidence of pneumonia.  As we discussed her symptoms are consistent with bronchitis or chest cold.  Greater than 90% of the time these are due to viral causes and can last for couple of weeks. -Take the Promethazine DM during the day (up to 3 times) and the Tussionex at bedtime for cough.  Make sure to increase rest and fluids. -Continue the doxycycline.  This will cover you for any potential community-acquired pneumonia but as we discussed, the x-ray does not show any evidence of pneumonia. -If you run a fever or have any shortness of breath you should be seen again.     ED Prescriptions     Medication Sig Dispense Auth. Provider   predniSONE (DELTASONE) 20 MG tablet Take 2 tablets (40 mg total) by mouth daily for 5 days. 10 tablet Danton Clap, PA-C   chlorpheniramine-HYDROcodone (TUSSIONEX PENNKINETIC ER) 10-8 MG/5ML SUER Take 5 mLs by mouth at bedtime as needed for cough. 70 mL Danton Clap, PA-C      PDMP not reviewed this encounter.   Danton Clap, PA-C 12/20/20 2007

## 2020-12-20 NOTE — Discharge Instructions (Signed)
-  The chest x-ray does not show any evidence of pneumonia.  As we discussed her symptoms are consistent with bronchitis or chest cold.  Greater than 90% of the time these are due to viral causes and can last for couple of weeks. -Take the Promethazine DM during the day (up to 3 times) and the Tussionex at bedtime for cough.  Make sure to increase rest and fluids. -Continue the doxycycline.  This will cover you for any potential community-acquired pneumonia but as we discussed, the x-ray does not show any evidence of pneumonia. -If you run a fever or have any shortness of breath you should be seen again.

## 2020-12-26 ENCOUNTER — Ambulatory Visit: Payer: Medicare Other | Admitting: Physician Assistant

## 2021-01-09 DIAGNOSIS — R296 Repeated falls: Secondary | ICD-10-CM | POA: Diagnosis not present

## 2021-01-09 DIAGNOSIS — Z9181 History of falling: Secondary | ICD-10-CM | POA: Diagnosis not present

## 2021-01-09 DIAGNOSIS — R2681 Unsteadiness on feet: Secondary | ICD-10-CM | POA: Diagnosis not present

## 2021-01-11 DIAGNOSIS — R296 Repeated falls: Secondary | ICD-10-CM | POA: Diagnosis not present

## 2021-01-11 DIAGNOSIS — Z9181 History of falling: Secondary | ICD-10-CM | POA: Diagnosis not present

## 2021-01-11 DIAGNOSIS — R2681 Unsteadiness on feet: Secondary | ICD-10-CM | POA: Diagnosis not present

## 2021-01-16 DIAGNOSIS — Z9181 History of falling: Secondary | ICD-10-CM | POA: Diagnosis not present

## 2021-01-16 DIAGNOSIS — R296 Repeated falls: Secondary | ICD-10-CM | POA: Diagnosis not present

## 2021-01-16 DIAGNOSIS — R2681 Unsteadiness on feet: Secondary | ICD-10-CM | POA: Diagnosis not present

## 2021-01-18 DIAGNOSIS — R2681 Unsteadiness on feet: Secondary | ICD-10-CM | POA: Diagnosis not present

## 2021-01-18 DIAGNOSIS — Z9181 History of falling: Secondary | ICD-10-CM | POA: Diagnosis not present

## 2021-01-18 DIAGNOSIS — R296 Repeated falls: Secondary | ICD-10-CM | POA: Diagnosis not present

## 2021-01-23 DIAGNOSIS — Z9181 History of falling: Secondary | ICD-10-CM | POA: Diagnosis not present

## 2021-01-23 DIAGNOSIS — R2681 Unsteadiness on feet: Secondary | ICD-10-CM | POA: Diagnosis not present

## 2021-01-23 DIAGNOSIS — Z23 Encounter for immunization: Secondary | ICD-10-CM | POA: Diagnosis not present

## 2021-01-23 DIAGNOSIS — R296 Repeated falls: Secondary | ICD-10-CM | POA: Diagnosis not present

## 2021-01-25 DIAGNOSIS — R296 Repeated falls: Secondary | ICD-10-CM | POA: Diagnosis not present

## 2021-01-25 DIAGNOSIS — R2681 Unsteadiness on feet: Secondary | ICD-10-CM | POA: Diagnosis not present

## 2021-01-25 DIAGNOSIS — Z9181 History of falling: Secondary | ICD-10-CM | POA: Diagnosis not present

## 2021-01-30 DIAGNOSIS — Z9181 History of falling: Secondary | ICD-10-CM | POA: Diagnosis not present

## 2021-01-30 DIAGNOSIS — R296 Repeated falls: Secondary | ICD-10-CM | POA: Diagnosis not present

## 2021-01-30 DIAGNOSIS — R2681 Unsteadiness on feet: Secondary | ICD-10-CM | POA: Diagnosis not present

## 2021-02-01 DIAGNOSIS — R296 Repeated falls: Secondary | ICD-10-CM | POA: Diagnosis not present

## 2021-02-01 DIAGNOSIS — Z9181 History of falling: Secondary | ICD-10-CM | POA: Diagnosis not present

## 2021-02-01 DIAGNOSIS — R2681 Unsteadiness on feet: Secondary | ICD-10-CM | POA: Diagnosis not present

## 2021-02-01 IMAGING — MG DIGITAL SCREENING BILAT W/ TOMO W/ CAD
8 series · 8 of 24 positions shown · non-contrast
Comparison: Previous exam(s).

CLINICAL DATA: Screening.

EXAM:
DIGITAL SCREENING BILATERAL MAMMOGRAM WITH TOMO AND CAD

[L MLO synth-2D]
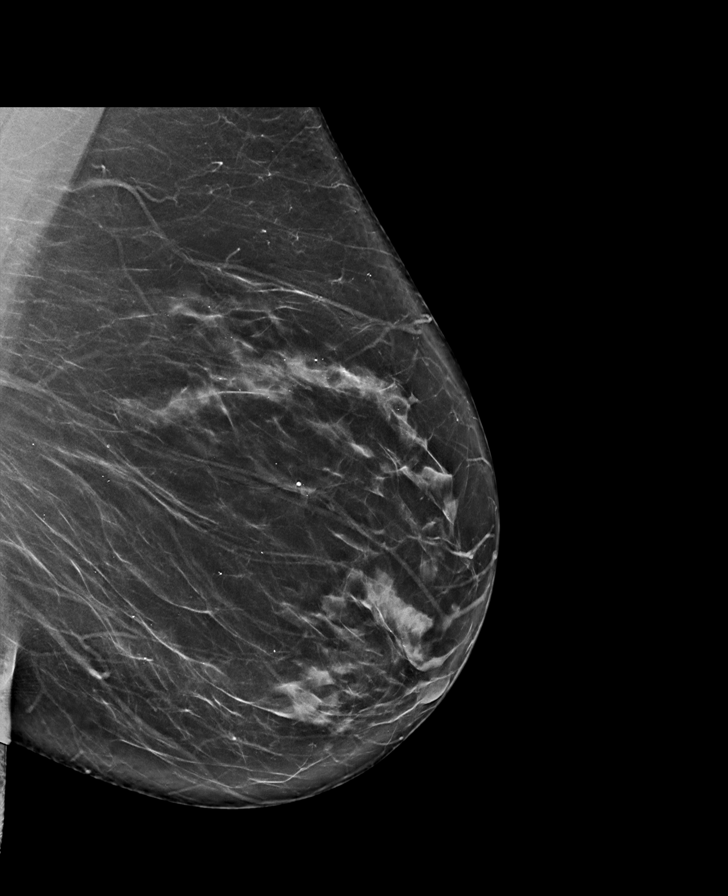

[R MLO synth-2D]
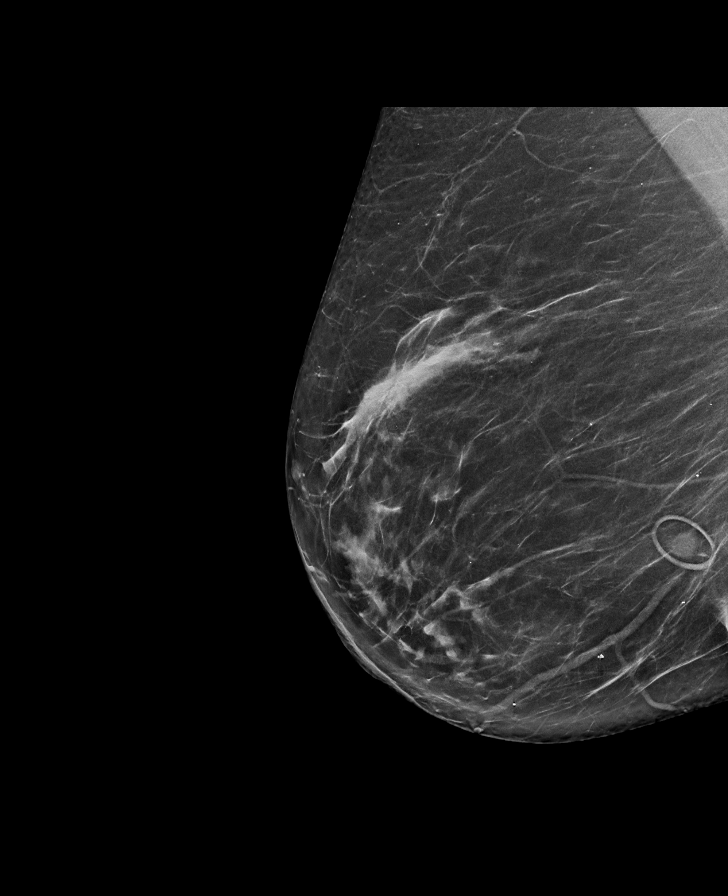

[L CC synth-2D]
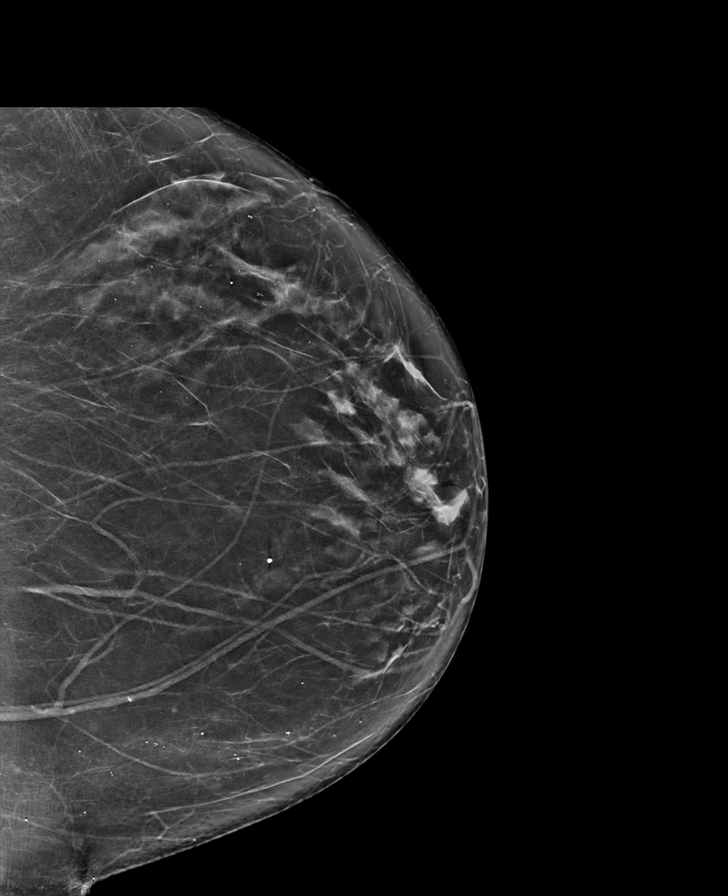

[R CC synth-2D]
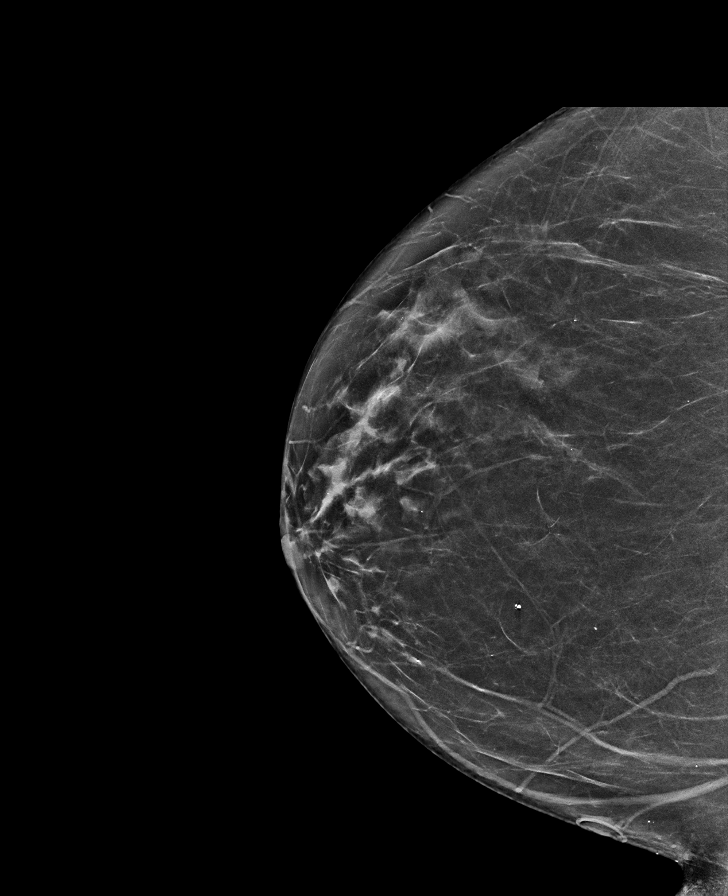

[L MLO tomo · tomo slice 42/83.0]
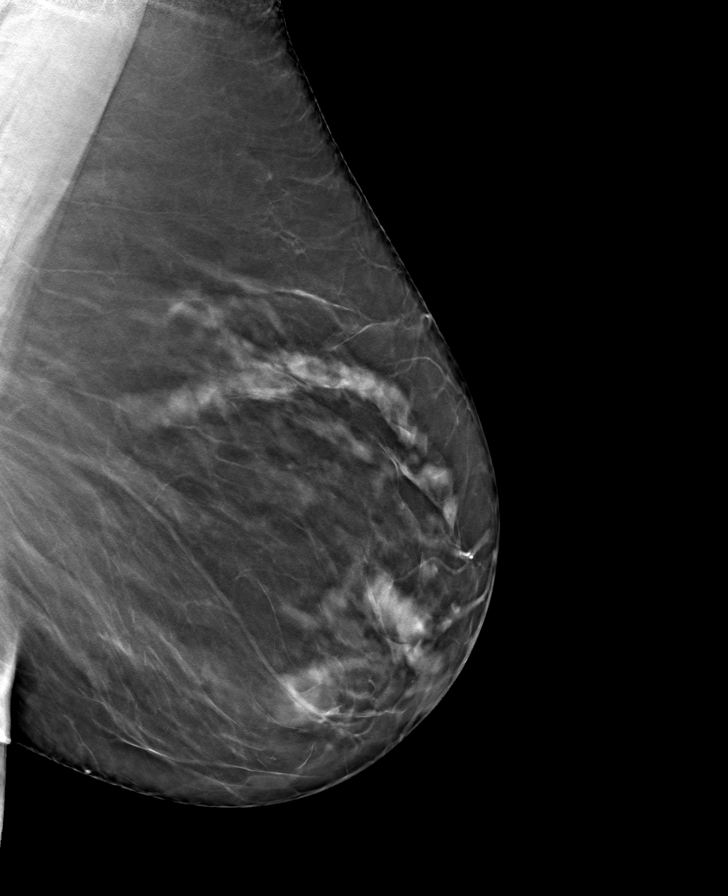

[R MLO tomo · tomo slice 38/75.0]
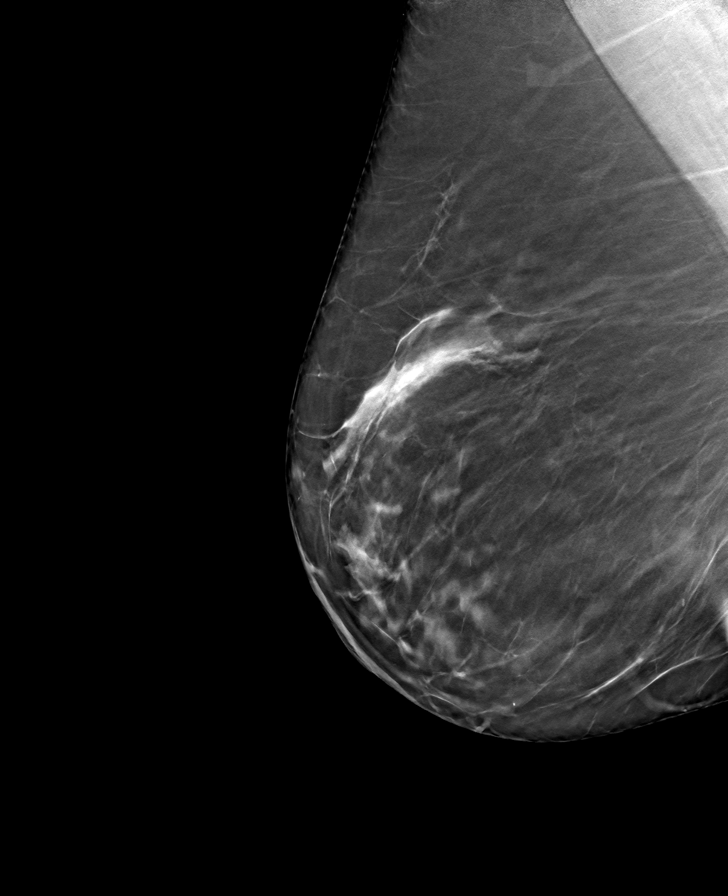

[R CC tomo · tomo slice 35/69.0]
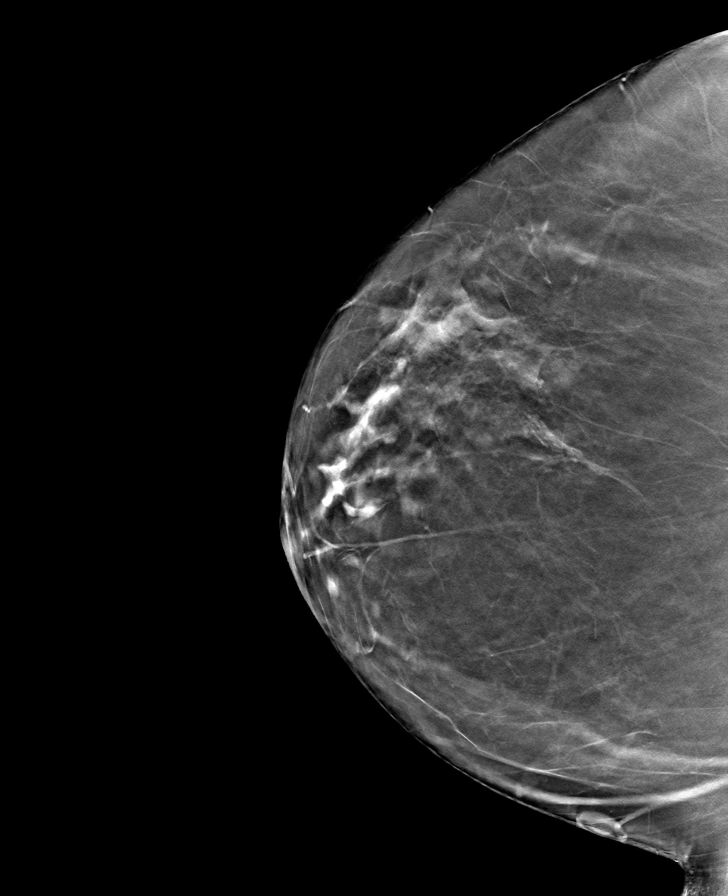

[L CC tomo · tomo slice 37/72.0]
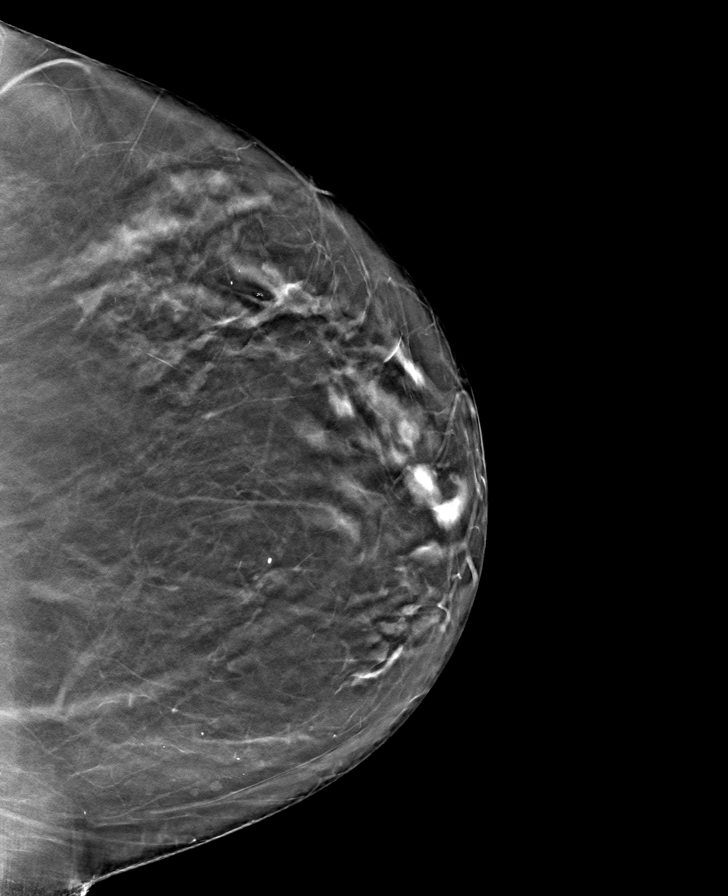

[8 of 24 positions shown; findings below may reference images not displayed]

ACR Breast Density Category b: There are scattered areas of
fibroglandular density.
FINDINGS: There are no findings suspicious for malignancy. Images were
processed with CAD.
IMPRESSION: No mammographic evidence of malignancy. A result letter of this
screening mammogram will be mailed directly to the patient.

RECOMMENDATION:
Screening mammogram in one year. (Code:[id number])

BI-RADS CATEGORY  1: Negative.

## 2021-02-15 DIAGNOSIS — Z0000 Encounter for general adult medical examination without abnormal findings: Secondary | ICD-10-CM | POA: Diagnosis not present

## 2021-03-03 DIAGNOSIS — T7840XS Allergy, unspecified, sequela: Secondary | ICD-10-CM | POA: Diagnosis not present

## 2021-03-03 DIAGNOSIS — F32A Depression, unspecified: Secondary | ICD-10-CM | POA: Diagnosis not present

## 2021-03-03 DIAGNOSIS — Z1211 Encounter for screening for malignant neoplasm of colon: Secondary | ICD-10-CM | POA: Diagnosis not present

## 2021-03-03 DIAGNOSIS — Z1231 Encounter for screening mammogram for malignant neoplasm of breast: Secondary | ICD-10-CM | POA: Diagnosis not present

## 2021-03-03 DIAGNOSIS — I10 Essential (primary) hypertension: Secondary | ICD-10-CM | POA: Diagnosis not present

## 2021-03-03 DIAGNOSIS — E785 Hyperlipidemia, unspecified: Secondary | ICD-10-CM | POA: Diagnosis not present

## 2021-03-03 DIAGNOSIS — E039 Hypothyroidism, unspecified: Secondary | ICD-10-CM | POA: Diagnosis not present

## 2021-03-03 DIAGNOSIS — R7303 Prediabetes: Secondary | ICD-10-CM | POA: Diagnosis not present

## 2021-03-03 DIAGNOSIS — F419 Anxiety disorder, unspecified: Secondary | ICD-10-CM | POA: Diagnosis not present

## 2021-03-03 DIAGNOSIS — Z6833 Body mass index (BMI) 33.0-33.9, adult: Secondary | ICD-10-CM | POA: Diagnosis not present

## 2021-03-14 DIAGNOSIS — Z85820 Personal history of malignant melanoma of skin: Secondary | ICD-10-CM | POA: Diagnosis not present

## 2021-03-14 DIAGNOSIS — L814 Other melanin hyperpigmentation: Secondary | ICD-10-CM | POA: Diagnosis not present

## 2021-03-14 DIAGNOSIS — L718 Other rosacea: Secondary | ICD-10-CM | POA: Diagnosis not present

## 2021-03-14 DIAGNOSIS — L821 Other seborrheic keratosis: Secondary | ICD-10-CM | POA: Diagnosis not present

## 2021-03-14 DIAGNOSIS — D1801 Hemangioma of skin and subcutaneous tissue: Secondary | ICD-10-CM | POA: Diagnosis not present

## 2021-03-14 DIAGNOSIS — L720 Epidermal cyst: Secondary | ICD-10-CM | POA: Diagnosis not present

## 2021-03-14 DIAGNOSIS — L570 Actinic keratosis: Secondary | ICD-10-CM | POA: Diagnosis not present

## 2021-03-14 DIAGNOSIS — L918 Other hypertrophic disorders of the skin: Secondary | ICD-10-CM | POA: Diagnosis not present

## 2021-03-14 DIAGNOSIS — L400 Psoriasis vulgaris: Secondary | ICD-10-CM | POA: Diagnosis not present

## 2021-03-24 ENCOUNTER — Other Ambulatory Visit: Payer: Self-pay | Admitting: Thoracic Surgery

## 2021-03-24 ENCOUNTER — Other Ambulatory Visit: Payer: Self-pay | Admitting: Student

## 2021-03-24 DIAGNOSIS — Z1231 Encounter for screening mammogram for malignant neoplasm of breast: Secondary | ICD-10-CM

## 2021-03-28 ENCOUNTER — Encounter: Payer: Self-pay | Admitting: Gastroenterology

## 2021-04-12 DIAGNOSIS — M79644 Pain in right finger(s): Secondary | ICD-10-CM | POA: Diagnosis not present

## 2021-04-12 DIAGNOSIS — I10 Essential (primary) hypertension: Secondary | ICD-10-CM | POA: Diagnosis not present

## 2021-04-12 DIAGNOSIS — M79641 Pain in right hand: Secondary | ICD-10-CM | POA: Diagnosis not present

## 2021-04-12 DIAGNOSIS — H938X1 Other specified disorders of right ear: Secondary | ICD-10-CM | POA: Diagnosis not present

## 2021-04-18 ENCOUNTER — Other Ambulatory Visit: Payer: Self-pay

## 2021-04-18 ENCOUNTER — Ambulatory Visit
Admission: RE | Admit: 2021-04-18 | Discharge: 2021-04-18 | Disposition: A | Discharge status: Home / Self Care | Payer: Medicare Other | Source: Ambulatory Visit | Attending: Student | Admitting: Student

## 2021-04-18 DIAGNOSIS — Z1231 Encounter for screening mammogram for malignant neoplasm of breast: Secondary | ICD-10-CM | POA: Diagnosis not present

## 2021-04-26 ENCOUNTER — Other Ambulatory Visit: Payer: Self-pay

## 2021-04-26 ENCOUNTER — Ambulatory Visit (AMBULATORY_SURGERY_CENTER): Payer: Medicare Other | Admitting: *Deleted

## 2021-04-26 VITALS — Ht 59.0 in | Wt 172.0 lb

## 2021-04-26 DIAGNOSIS — Z800 Family history of malignant neoplasm of digestive organs: Secondary | ICD-10-CM

## 2021-04-26 MED ORDER — PEG 3350-KCL-NA BICARB-NACL 420 G PO SOLR
4000.0000 mL | Freq: Once | ORAL | 0 refills | Status: AC
Start: 2021-04-26 — End: 2021-04-26

## 2021-04-26 NOTE — Progress Notes (Signed)

## 2021-05-03 ENCOUNTER — Telehealth: Payer: Self-pay | Admitting: Gastroenterology

## 2021-05-03 NOTE — Telephone Encounter (Signed)
Pt called to reschedule her colon appointment  She will call back after she verifies date to [facility]

## 2021-05-03 NOTE — Telephone Encounter (Signed)
Inbound call from patient stating that she has a few questions about her prep for her procedure on 3/1. Please advise

## 2021-05-04 DIAGNOSIS — H9313 Tinnitus, bilateral: Secondary | ICD-10-CM | POA: Diagnosis not present

## 2021-05-04 DIAGNOSIS — H6123 Impacted cerumen, bilateral: Secondary | ICD-10-CM | POA: Diagnosis not present

## 2021-05-04 DIAGNOSIS — H903 Sensorineural hearing loss, bilateral: Secondary | ICD-10-CM | POA: Diagnosis not present

## 2021-05-04 DIAGNOSIS — R220 Localized swelling, mass and lump, head: Secondary | ICD-10-CM | POA: Diagnosis not present

## 2021-05-05 DIAGNOSIS — M180 Bilateral primary osteoarthritis of first carpometacarpal joints: Secondary | ICD-10-CM | POA: Diagnosis not present

## 2021-05-05 DIAGNOSIS — G5603 Carpal tunnel syndrome, bilateral upper limbs: Secondary | ICD-10-CM | POA: Diagnosis not present

## 2021-05-10 ENCOUNTER — Encounter: Payer: Medicare Other | Admitting: Gastroenterology

## 2021-05-18 DIAGNOSIS — G5603 Carpal tunnel syndrome, bilateral upper limbs: Secondary | ICD-10-CM | POA: Diagnosis not present

## 2021-06-13 DIAGNOSIS — G5603 Carpal tunnel syndrome, bilateral upper limbs: Secondary | ICD-10-CM | POA: Diagnosis not present

## 2021-06-13 DIAGNOSIS — M189 Osteoarthritis of first carpometacarpal joint, unspecified: Secondary | ICD-10-CM | POA: Diagnosis not present

## 2021-08-09 DIAGNOSIS — L03119 Cellulitis of unspecified part of limb: Secondary | ICD-10-CM | POA: Diagnosis not present

## 2021-08-09 DIAGNOSIS — L02419 Cutaneous abscess of limb, unspecified: Secondary | ICD-10-CM | POA: Diagnosis not present

## 2021-08-09 DIAGNOSIS — L409 Psoriasis, unspecified: Secondary | ICD-10-CM | POA: Diagnosis not present

## 2021-08-10 DIAGNOSIS — R9431 Abnormal electrocardiogram [ECG] [EKG]: Secondary | ICD-10-CM | POA: Diagnosis not present

## 2021-08-10 DIAGNOSIS — Z9189 Other specified personal risk factors, not elsewhere classified: Secondary | ICD-10-CM | POA: Diagnosis not present

## 2021-08-10 DIAGNOSIS — I10 Essential (primary) hypertension: Secondary | ICD-10-CM | POA: Diagnosis not present

## 2021-08-10 DIAGNOSIS — Z6836 Body mass index (BMI) 36.0-36.9, adult: Secondary | ICD-10-CM | POA: Diagnosis not present

## 2021-08-18 ENCOUNTER — Other Ambulatory Visit: Payer: Self-pay

## 2021-08-18 ENCOUNTER — Ambulatory Visit (AMBULATORY_SURGERY_CENTER): Payer: Medicare Other

## 2021-08-18 VITALS — Ht 59.0 in | Wt 172.0 lb

## 2021-08-18 DIAGNOSIS — Z800 Family history of malignant neoplasm of digestive organs: Secondary | ICD-10-CM

## 2021-08-18 MED ORDER — PEG 3350-KCL-NA BICARB-NACL 420 G PO SOLR: 4000.0000 mL | Freq: Once | ORAL | 0 refills | Status: AC

## 2021-08-18 NOTE — Progress Notes (Signed)
Denies allergies to eggs or soy products. Denies complication of anesthesia or sedation. Denies use of weight loss medication. Denies use of O2.   Emmi instructions given for colonoscopy.  

## 2021-08-22 DIAGNOSIS — M25511 Pain in right shoulder: Secondary | ICD-10-CM | POA: Diagnosis not present

## 2021-08-22 DIAGNOSIS — G5603 Carpal tunnel syndrome, bilateral upper limbs: Secondary | ICD-10-CM | POA: Diagnosis not present

## 2021-08-29 DIAGNOSIS — S0511XA Contusion of eyeball and orbital tissues, right eye, initial encounter: Secondary | ICD-10-CM | POA: Diagnosis not present

## 2021-08-29 DIAGNOSIS — S8012XA Contusion of left lower leg, initial encounter: Secondary | ICD-10-CM | POA: Diagnosis not present

## 2021-08-29 DIAGNOSIS — S0003XA Contusion of scalp, initial encounter: Secondary | ICD-10-CM | POA: Diagnosis not present

## 2021-08-29 DIAGNOSIS — Z6834 Body mass index (BMI) 34.0-34.9, adult: Secondary | ICD-10-CM | POA: Diagnosis not present

## 2021-08-29 DIAGNOSIS — S0083XA Contusion of other part of head, initial encounter: Secondary | ICD-10-CM | POA: Diagnosis not present

## 2021-08-29 DIAGNOSIS — S60211A Contusion of right wrist, initial encounter: Secondary | ICD-10-CM | POA: Diagnosis not present

## 2021-08-29 DIAGNOSIS — S0990XA Unspecified injury of head, initial encounter: Secondary | ICD-10-CM | POA: Diagnosis not present

## 2021-08-29 DIAGNOSIS — S0512XA Contusion of eyeball and orbital tissues, left eye, initial encounter: Secondary | ICD-10-CM | POA: Diagnosis not present

## 2021-08-29 DIAGNOSIS — M47812 Spondylosis without myelopathy or radiculopathy, cervical region: Secondary | ICD-10-CM | POA: Diagnosis not present

## 2021-08-29 DIAGNOSIS — E119 Type 2 diabetes mellitus without complications: Secondary | ICD-10-CM | POA: Diagnosis not present

## 2021-08-29 DIAGNOSIS — I2510 Atherosclerotic heart disease of native coronary artery without angina pectoris: Secondary | ICD-10-CM | POA: Diagnosis not present

## 2021-08-29 DIAGNOSIS — I10 Essential (primary) hypertension: Secondary | ICD-10-CM | POA: Diagnosis not present

## 2021-08-29 DIAGNOSIS — Z79899 Other long term (current) drug therapy: Secondary | ICD-10-CM | POA: Diagnosis not present

## 2021-08-29 DIAGNOSIS — M25511 Pain in right shoulder: Secondary | ICD-10-CM | POA: Diagnosis not present

## 2021-08-29 DIAGNOSIS — S199XXA Unspecified injury of neck, initial encounter: Secondary | ICD-10-CM | POA: Diagnosis not present

## 2021-08-29 DIAGNOSIS — S0993XA Unspecified injury of face, initial encounter: Secondary | ICD-10-CM | POA: Diagnosis not present

## 2021-08-29 DIAGNOSIS — Z880 Allergy status to penicillin: Secondary | ICD-10-CM | POA: Diagnosis not present

## 2021-08-29 DIAGNOSIS — S8011XA Contusion of right lower leg, initial encounter: Secondary | ICD-10-CM | POA: Diagnosis not present

## 2021-08-29 DIAGNOSIS — M25531 Pain in right wrist: Secondary | ICD-10-CM | POA: Diagnosis not present

## 2021-09-07 DIAGNOSIS — F32A Depression, unspecified: Secondary | ICD-10-CM | POA: Diagnosis not present

## 2021-09-07 DIAGNOSIS — R7303 Prediabetes: Secondary | ICD-10-CM | POA: Diagnosis not present

## 2021-09-07 DIAGNOSIS — Z6834 Body mass index (BMI) 34.0-34.9, adult: Secondary | ICD-10-CM | POA: Diagnosis not present

## 2021-09-07 DIAGNOSIS — E785 Hyperlipidemia, unspecified: Secondary | ICD-10-CM | POA: Diagnosis not present

## 2021-09-07 DIAGNOSIS — I10 Essential (primary) hypertension: Secondary | ICD-10-CM | POA: Diagnosis not present

## 2021-09-07 DIAGNOSIS — E039 Hypothyroidism, unspecified: Secondary | ICD-10-CM | POA: Diagnosis not present

## 2021-09-07 DIAGNOSIS — R32 Unspecified urinary incontinence: Secondary | ICD-10-CM | POA: Diagnosis not present

## 2021-09-07 DIAGNOSIS — F419 Anxiety disorder, unspecified: Secondary | ICD-10-CM | POA: Diagnosis not present

## 2021-09-07 DIAGNOSIS — Z09 Encounter for follow-up examination after completed treatment for conditions other than malignant neoplasm: Secondary | ICD-10-CM | POA: Diagnosis not present

## 2021-09-11 DIAGNOSIS — R9431 Abnormal electrocardiogram [ECG] [EKG]: Secondary | ICD-10-CM | POA: Diagnosis not present

## 2021-09-18 ENCOUNTER — Encounter: Payer: Self-pay | Admitting: Gastroenterology

## 2021-09-18 ENCOUNTER — Ambulatory Visit (AMBULATORY_SURGERY_CENTER): Payer: Medicare Other | Admitting: Gastroenterology

## 2021-09-18 VITALS — BP 132/72 | HR 59 | Temp 98.4°F | Resp 15 | Ht 59.0 in | Wt 172.0 lb

## 2021-09-18 DIAGNOSIS — Z800 Family history of malignant neoplasm of digestive organs: Secondary | ICD-10-CM

## 2021-09-18 DIAGNOSIS — I10 Essential (primary) hypertension: Secondary | ICD-10-CM | POA: Diagnosis not present

## 2021-09-18 DIAGNOSIS — E119 Type 2 diabetes mellitus without complications: Secondary | ICD-10-CM | POA: Diagnosis not present

## 2021-09-18 DIAGNOSIS — Z1211 Encounter for screening for malignant neoplasm of colon: Secondary | ICD-10-CM

## 2021-09-18 MED ORDER — SODIUM CHLORIDE 0.9 % IV SOLN: 500.0000 mL | Freq: Once | INTRAVENOUS | Status: DC

## 2021-09-18 NOTE — Progress Notes (Signed)
Report to PACU, RN, vss, BBS= Clear.  

## 2021-09-18 NOTE — Patient Instructions (Signed)
YOU HAD AN ENDOSCOPIC PROCEDURE TODAY AT THE [facility]:   Refer to the procedure report that was given to you for any specific questions about what was found during the examination.  If the procedure report does not answer your questions, please call your gastroenterologist to clarify.  If you requested that your care partner not be given the details of your procedure findings, then the procedure report has been included in a sealed envelope for you to review at your convenience later.  YOU SHOULD EXPECT: Some feelings of bloating in the abdomen. Passage of more gas than usual.  Walking can help get rid of the air that was put into your GI tract during the procedure and reduce the bloating. If you had a lower endoscopy (such as a colonoscopy or flexible sigmoidoscopy) you may notice spotting of blood in your stool or on the toilet paper. If you underwent a bowel prep for your procedure, you may not have a normal bowel movement for a few days.  Please Note:  You might notice some irritation and congestion in your nose or some drainage.  This is from the oxygen used during your procedure.  There is no need for concern and it should clear up in a day or so.  SYMPTOMS TO REPORT IMMEDIATELY:  Following lower endoscopy (colonoscopy or flexible sigmoidoscopy):  Excessive amounts of blood in the stool  Significant tenderness or worsening of abdominal pains  Swelling of the abdomen that is new, acute  Fever of 100F or higher  For urgent or emergent issues, a gastroenterologist can be reached at any hour by calling [phone]. Do not use MyChart messaging for urgent concerns.    DIET:  We do recommend a small meal at first, but then you may proceed to your regular diet.  Drink plenty of fluids but you should avoid alcoholic beverages for 24 hours.  ACTIVITY:  You should plan to take it easy for the rest of today and you should NOT DRIVE or use heavy machinery until tomorrow (because of  the sedation medicines used during the test).    FOLLOW UP: Our staff will call the number listed on your records the next business day following your procedure.  We will call around 7:15- 8:00 am to check on you and address any questions or concerns that you may have regarding the information given to you following your procedure. If we do not reach you, we will leave a message.  If you develop any symptoms (ie: fever, flu-like symptoms, shortness of breath, cough etc.) before then, please call [phone].  If you test positive for Covid 19 in the 2 weeks post procedure, please call and report this information to us.    If any biopsies were taken you will be contacted by phone or by letter within the next 1-3 weeks.  Please call us at [phone] if you have not heard about the biopsies in 3 weeks.    SIGNATURES/CONFIDENTIALITY: You and/or your care partner have signed paperwork which will be entered into your electronic medical record.  These signatures attest to the fact that that the information above on your After Visit Summary has been reviewed and is understood.  Full responsibility of the confidentiality of this discharge information lies with you and/or your care-partner.  

## 2021-09-18 NOTE — Progress Notes (Signed)
See 09/07/2021 H&P, no changes

## 2021-09-18 NOTE — Progress Notes (Signed)
Vitals-AS  Pt's states no medical or surgical changes since previsit or office visit.   Patient did fall at her house last week.  She did go to the ED.  No broken bones.

## 2021-09-18 NOTE — Op Note (Signed)
[place] Patient Name: [name] Procedure Date: 09/18/2021 8:39 AM MRN: [id number] Endoscopist: Ladene Artist , MD Age: 76 Referring MD:  Date of Birth: 1945/09/14 Gender: Female Account #: [id number] Procedure:                Colonoscopy Indications:              Screening in patient at increased risk: Family                            history of 1st-degree relative with colorectal                            cancer Medicines:                Monitored Anesthesia Care Procedure:                Pre-Anesthesia Assessment:                           - Prior to the procedure, a History and Physical                            was performed, and patient medications and                            allergies were reviewed. The patient's tolerance of                            previous anesthesia was also reviewed. The risks                            and benefits of the procedure and the sedation                            options and risks were discussed with the patient.                            All questions were answered, and informed consent                            was obtained. Prior Anticoagulants: The patient has                            taken no previous anticoagulant or antiplatelet                            agents. ASA Grade Assessment: II - A patient with                            mild systemic disease. After reviewing the risks                            and benefits, the patient was deemed in  satisfactory condition to undergo the procedure.                           After obtaining informed consent, the colonoscope                            was passed under direct vision. Throughout the                            procedure, the patient's blood pressure, pulse, and                            oxygen saturations were monitored continuously. The                            CF [id number] #[id number] was introduced through the anus                             and advanced to the the cecum, identified by                            appendiceal orifice and ileocecal valve. The                            ileocecal valve, appendiceal orifice, and rectum                            were photographed. The quality of the bowel                            preparation was good. The colonoscopy was performed                            without difficulty. The patient tolerated the                            procedure well. Scope In: 8:44:07 AM Scope Out: 8:56:06 AM Scope Withdrawal Time: 0 hours 9 minutes 22 seconds  Total Procedure Duration: 0 hours 11 minutes 59 seconds  Findings:                 The perianal and digital rectal examinations were                            normal.                           A single small localized angiodysplastic lesion                            without bleeding was found in the descending colon.                           Internal hemorrhoids were found during  retroflexion. The hemorrhoids were moderate and                            Grade I (internal hemorrhoids that do not prolapse).                           The exam was otherwise without abnormality on                            direct and retroflexion views. Complications:            No immediate complications. Estimated blood loss:                            None. Estimated Blood Loss:     Estimated blood loss: none. Impression:               - A single non-bleeding colonic angiodysplastic                            lesion.                           - Internal hemorrhoids.                           - The examination was otherwise normal on direct                            and retroflexion views.                           - No specimens collected. Recommendation:           - Repeat colonoscopy in 5 years for screening                            purposes.                           - Patient has a contact number  available for                            emergencies. The signs and symptoms of potential                            delayed complications were discussed with the                            patient. Return to normal activities tomorrow.                            Written discharge instructions were provided to the                            patient.                           -  Resume previous diet.                           - Continue present medications. Ladene Artist, MD 09/18/2021 8:59:24 AM This report has been signed electronically.

## 2021-09-19 ENCOUNTER — Telehealth: Payer: Self-pay

## 2021-09-19 DIAGNOSIS — L905 Scar conditions and fibrosis of skin: Secondary | ICD-10-CM | POA: Diagnosis not present

## 2021-09-19 DIAGNOSIS — L400 Psoriasis vulgaris: Secondary | ICD-10-CM | POA: Diagnosis not present

## 2021-09-19 DIAGNOSIS — L718 Other rosacea: Secondary | ICD-10-CM | POA: Diagnosis not present

## 2021-09-19 DIAGNOSIS — D1801 Hemangioma of skin and subcutaneous tissue: Secondary | ICD-10-CM | POA: Diagnosis not present

## 2021-09-19 DIAGNOSIS — D485 Neoplasm of uncertain behavior of skin: Secondary | ICD-10-CM | POA: Diagnosis not present

## 2021-09-19 DIAGNOSIS — D692 Other nonthrombocytopenic purpura: Secondary | ICD-10-CM | POA: Diagnosis not present

## 2021-09-19 DIAGNOSIS — L814 Other melanin hyperpigmentation: Secondary | ICD-10-CM | POA: Diagnosis not present

## 2021-09-19 DIAGNOSIS — L821 Other seborrheic keratosis: Secondary | ICD-10-CM | POA: Diagnosis not present

## 2021-09-19 DIAGNOSIS — L720 Epidermal cyst: Secondary | ICD-10-CM | POA: Diagnosis not present

## 2021-09-19 DIAGNOSIS — L570 Actinic keratosis: Secondary | ICD-10-CM | POA: Diagnosis not present

## 2021-09-19 DIAGNOSIS — Z85820 Personal history of malignant melanoma of skin: Secondary | ICD-10-CM | POA: Diagnosis not present

## 2021-09-19 DIAGNOSIS — D225 Melanocytic nevi of trunk: Secondary | ICD-10-CM | POA: Diagnosis not present

## 2021-09-19 NOTE — Telephone Encounter (Signed)
  Follow up Call-     09/18/2021    8:21 AM 09/18/2021    8:09 AM  Call back number  Post procedure Call Back phone  # [phone]   Permission to leave phone message  Yes     Patient questions:  Do you have a fever, pain , or abdominal swelling? No. Pain Score  0 *  Have you tolerated food without any problems? Yes.    Have you been able to return to your normal activities? No.  Do you have any questions about your discharge instructions: Diet   No. Medications  No. Follow up visit  No.  Do you have questions or concerns about your Care? No.  Actions: * If pain score is 4 or above: No action needed, pain <4.

## 2021-11-17 ENCOUNTER — Other Ambulatory Visit: Payer: Self-pay | Admitting: Otolaryngology

## 2021-11-17 DIAGNOSIS — H00019 Hordeolum externum unspecified eye, unspecified eyelid: Secondary | ICD-10-CM | POA: Diagnosis not present

## 2021-11-17 DIAGNOSIS — K118 Other diseases of salivary glands: Secondary | ICD-10-CM

## 2021-11-17 DIAGNOSIS — D37030 Neoplasm of uncertain behavior of the parotid salivary glands: Secondary | ICD-10-CM | POA: Diagnosis not present

## 2021-11-17 DIAGNOSIS — H6123 Impacted cerumen, bilateral: Secondary | ICD-10-CM | POA: Diagnosis not present

## 2021-11-22 DIAGNOSIS — H0015 Chalazion left lower eyelid: Secondary | ICD-10-CM | POA: Diagnosis not present

## 2021-11-27 DIAGNOSIS — H0015 Chalazion left lower eyelid: Secondary | ICD-10-CM | POA: Diagnosis not present

## 2021-11-30 ENCOUNTER — Ambulatory Visit
Admission: RE | Admit: 2021-11-30 | Discharge: 2021-11-30 | Disposition: A | Discharge status: Home / Self Care | Payer: Medicare Other | Source: Ambulatory Visit | Attending: Otolaryngology | Admitting: Otolaryngology

## 2021-11-30 DIAGNOSIS — M4312 Spondylolisthesis, cervical region: Secondary | ICD-10-CM | POA: Diagnosis not present

## 2021-11-30 DIAGNOSIS — M47812 Spondylosis without myelopathy or radiculopathy, cervical region: Secondary | ICD-10-CM | POA: Diagnosis not present

## 2021-11-30 DIAGNOSIS — K118 Other diseases of salivary glands: Secondary | ICD-10-CM

## 2021-11-30 DIAGNOSIS — M4802 Spinal stenosis, cervical region: Secondary | ICD-10-CM | POA: Diagnosis not present

## 2021-11-30 MED ORDER — IOPAMIDOL (ISOVUE-300) INJECTION 61%
100.0000 mL | Freq: Once | INTRAVENOUS | Status: AC | PRN
Start: 2021-11-30 — End: 2021-11-30
  Administered 2021-11-30: 100 mL via INTRAVENOUS

## 2021-12-13 ENCOUNTER — Other Ambulatory Visit: Payer: Self-pay | Admitting: Otolaryngology

## 2021-12-13 DIAGNOSIS — M26629 Arthralgia of temporomandibular joint, unspecified side: Secondary | ICD-10-CM | POA: Diagnosis not present

## 2021-12-13 DIAGNOSIS — R591 Generalized enlarged lymph nodes: Secondary | ICD-10-CM

## 2021-12-13 DIAGNOSIS — M1990 Unspecified osteoarthritis, unspecified site: Secondary | ICD-10-CM | POA: Diagnosis not present

## 2021-12-13 DIAGNOSIS — R599 Enlarged lymph nodes, unspecified: Secondary | ICD-10-CM | POA: Diagnosis not present

## 2021-12-25 DIAGNOSIS — H0015 Chalazion left lower eyelid: Secondary | ICD-10-CM | POA: Diagnosis not present

## 2022-01-09 DIAGNOSIS — M4802 Spinal stenosis, cervical region: Secondary | ICD-10-CM | POA: Diagnosis not present

## 2022-01-16 DIAGNOSIS — G5601 Carpal tunnel syndrome, right upper limb: Secondary | ICD-10-CM | POA: Diagnosis not present

## 2022-01-19 DIAGNOSIS — H0015 Chalazion left lower eyelid: Secondary | ICD-10-CM | POA: Diagnosis not present

## 2022-01-26 DIAGNOSIS — G5601 Carpal tunnel syndrome, right upper limb: Secondary | ICD-10-CM | POA: Diagnosis not present

## 2022-02-19 DIAGNOSIS — Z23 Encounter for immunization: Secondary | ICD-10-CM | POA: Diagnosis not present

## 2022-02-26 DIAGNOSIS — I10 Essential (primary) hypertension: Secondary | ICD-10-CM | POA: Diagnosis not present

## 2022-02-26 DIAGNOSIS — Z6835 Body mass index (BMI) 35.0-35.9, adult: Secondary | ICD-10-CM | POA: Diagnosis not present

## 2022-02-26 DIAGNOSIS — E669 Obesity, unspecified: Secondary | ICD-10-CM | POA: Diagnosis not present

## 2022-02-26 DIAGNOSIS — I351 Nonrheumatic aortic (valve) insufficiency: Secondary | ICD-10-CM | POA: Diagnosis not present

## 2022-03-08 DIAGNOSIS — H0015 Chalazion left lower eyelid: Secondary | ICD-10-CM | POA: Diagnosis not present

## 2022-03-09 DIAGNOSIS — M4802 Spinal stenosis, cervical region: Secondary | ICD-10-CM | POA: Diagnosis not present

## 2022-03-09 DIAGNOSIS — M50221 Other cervical disc displacement at C4-C5 level: Secondary | ICD-10-CM | POA: Diagnosis not present

## 2022-03-09 DIAGNOSIS — M4314 Spondylolisthesis, thoracic region: Secondary | ICD-10-CM | POA: Diagnosis not present

## 2022-04-02 ENCOUNTER — Ambulatory Visit
Admission: RE | Admit: 2022-04-02 | Discharge: 2022-04-02 | Disposition: A | Discharge status: Home / Self Care | Payer: Medicare Other | Source: Ambulatory Visit | Attending: Otolaryngology | Admitting: Otolaryngology

## 2022-04-02 DIAGNOSIS — R591 Generalized enlarged lymph nodes: Secondary | ICD-10-CM

## 2024-01-19 ENCOUNTER — Encounter (INDEPENDENT_AMBULATORY_CARE_PROVIDER_SITE_OTHER): Payer: Self-pay

## 2024-01-19 ENCOUNTER — Other Ambulatory Visit: Payer: Self-pay | Admitting: Medical Genetics

## 2024-01-20 ENCOUNTER — Other Ambulatory Visit: Payer: Self-pay | Admitting: Medical Genetics

## 2024-01-21 ENCOUNTER — Other Ambulatory Visit
Admission: RE | Admit: 2024-01-21 | Discharge: 2024-01-21 | Disposition: A | Discharge status: Home / Self Care | Payer: Self-pay | Source: Ambulatory Visit | Attending: Medical Genetics | Admitting: Medical Genetics

## 2024-02-03 LAB — GENECONNECT MOLECULAR SCREEN: Genetic Analysis Overall Interpretation: NEGATIVE
